# Patient Record
Sex: Male | Born: 1960 | Race: White | Hispanic: No | Marital: Married | Smoking: Never smoker
Health system: Southern US, Community
[De-identification: ages and names within clinical notes are randomized; demographics above are authoritative.]

## PROBLEM LIST (undated history)

## (undated) DIAGNOSIS — E669 Obesity, unspecified: Secondary | ICD-10-CM

## (undated) DIAGNOSIS — Z860100 Personal history of colon polyps, unspecified: Secondary | ICD-10-CM

## (undated) DIAGNOSIS — Z1389 Encounter for screening for other disorder: Secondary | ICD-10-CM

## (undated) HISTORY — DX: Obesity, unspecified: E66.9

## (undated) HISTORY — DX: Personal history of colonic polyps: Z86.010

## (undated) HISTORY — DX: Encounter for screening for other disorder: Z13.89

## (undated) HISTORY — PX: TONSILLECTOMY: SUR1361

## (undated) HISTORY — DX: Personal history of colon polyps, unspecified: Z86.0100

## (undated) HISTORY — PX: SYNOVECTOMY: SHX133

---

## 1999-03-02 HISTORY — PX: KNEE SURGERY: SHX244

## 2007-12-19 ENCOUNTER — Ambulatory Visit: Payer: Self-pay | Admitting: Family Medicine

## 2007-12-28 ENCOUNTER — Ambulatory Visit: Payer: Self-pay | Admitting: Family Medicine

## 2008-03-11 ENCOUNTER — Ambulatory Visit: Payer: Self-pay | Admitting: Vascular Surgery

## 2008-07-01 HISTORY — PX: SHOULDER SURGERY: SHX246

## 2008-07-01 HISTORY — PX: CYST REMOVAL LEG: SHX6280

## 2009-06-30 ENCOUNTER — Emergency Department: Payer: Self-pay | Admitting: Emergency Medicine

## 2011-01-08 ENCOUNTER — Ambulatory Visit: Payer: Self-pay | Admitting: General Surgery

## 2011-01-08 HISTORY — PX: COLONOSCOPY: SHX174

## 2011-01-08 LAB — HM COLONOSCOPY

## 2011-01-10 LAB — PATHOLOGY REPORT

## 2011-07-02 HISTORY — PX: CHOLECYSTECTOMY: SHX55

## 2012-07-24 LAB — TSH: TSH: 3.09 u[IU]/mL (ref 0.41–5.90)

## 2012-07-24 LAB — HEMOGLOBIN A1C: Hemoglobin A1C: 5.6

## 2012-10-07 LAB — CBC AND DIFFERENTIAL
HCT: 49 % (ref 41–53)
Hemoglobin: 16.9 g/dL (ref 13.5–17.5)
Platelets: 185 10*3/uL (ref 150–399)
WBC: 6.6 10^3/mL

## 2012-10-07 LAB — BASIC METABOLIC PANEL
BUN: 15 mg/dL (ref 4–21)
CREATININE: 1.1 mg/dL (ref 0.6–1.3)
GLUCOSE: 82 mg/dL
POTASSIUM: 4.3 mmol/L (ref 3.4–5.3)
SODIUM: 141 mmol/L (ref 137–147)

## 2012-10-08 ENCOUNTER — Ambulatory Visit: Payer: Self-pay | Admitting: Family Medicine

## 2012-10-12 ENCOUNTER — Other Ambulatory Visit: Payer: Self-pay | Admitting: General Surgery

## 2012-10-12 ENCOUNTER — Encounter: Payer: Self-pay | Admitting: General Surgery

## 2012-10-12 ENCOUNTER — Ambulatory Visit (INDEPENDENT_AMBULATORY_CARE_PROVIDER_SITE_OTHER): Payer: BC Managed Care – PPO | Admitting: General Surgery

## 2012-10-12 VITALS — BP 128/70 | HR 74 | Resp 12 | Ht 71.0 in | Wt 232.0 lb

## 2012-10-12 DIAGNOSIS — K8020 Calculus of gallbladder without cholecystitis without obstruction: Secondary | ICD-10-CM | POA: Insufficient documentation

## 2012-10-12 NOTE — Patient Instructions (Addendum)
Patient to have labs drawn at [facility] lab today (Hepatic function). Laparoscopic Cholecystectomy with Intraoperative Cholangiogram. The procedure, including it's potential risks and complications (including but not limited to infection, bleeding, injury to intra-abdominal organs or bile ducts, bile leak, poor cosmetic result, sepsis and death) were discussed with the patient in detail. Non-operative options, including their inherent risks (acute calculous cholecystitis with possible choledocholithiasis or gallstone pancreatitis, with the risk of ascending cholangitis, sepsis, and death) were discussed as well. The patient expressed and understanding of what we discussed and wishes to proceed with laparoscopic cholecystectomy. The patient further understands that if it is technically not possible, or it is unsafe to proceed laparoscopically, that I will convert to an open cholecystectomy.  Patient's surgery has been scheduled for 10-16-12 at [facility].

## 2012-10-12 NOTE — Progress Notes (Signed)
Patient ID: Albert Carpenter, male   DOB: 1961/03/15, 52 y.o.   MRN: [id number]  Chief Complaint  Patient presents with  . Abdominal Pain    HPI Albert Carpenter is a 52 y.o. male here today to discuss recurrent episodes of biliary colic. The patient reports his first episode occurred in 2012. He had 2 severe episodes that year with minimal symptoms until the last 6 months. In the last 4-8 weeks he has had weekly episodes of epigastric pain that usually lasts less than an hour. Most of these episodes have been triggered by dietary intake.    The patient's last episode of pain occurred about one week ago. He was seen in his primary care office and liver function studies at that time showed a significant abnormality prompting abdominal ultrasound.  HPI  History reviewed. No pertinent past medical history.  Past Surgical History  Procedure Laterality Date  . Knee surgery Bilateral 2000's  . Shoulder surgery Right 2010  . Cyst removal leg Left 2010  . Tonsillectomy  kid  . Colonoscopy  2011    No family history on file.  Social History History  Substance Use Topics  . Smoking status: Not on file  . Smokeless tobacco: Never Used  . Alcohol Use: Yes    Allergies  Allergen Reactions  . Bee Venom Swelling    Current Outpatient Prescriptions  Medication Sig Dispense Refill  . Multiple Vitamin (MULTI VITAMIN MENS PO) Take by mouth.       No current facility-administered medications for this visit.    Review of Systems Review of Systems  Constitutional: Negative.   Respiratory: Negative.   Cardiovascular: Negative.   Gastrointestinal: Positive for nausea and vomiting (last week). Negative for diarrhea, constipation, blood in stool, abdominal distention, anal bleeding and rectal pain.    Blood pressure 128/70, pulse 74, resp. rate 12, height 5\' 11"  (1.803 m), weight 232 lb (105.235 kg).  Physical Exam Physical Exam  Constitutional: He appears well-developed and  well-nourished.  Neck: Normal range of motion. Neck supple.  Cardiovascular: Normal rate, regular rhythm and normal heart sounds.   Pulmonary/Chest: Effort normal and breath sounds normal.  Abdominal: Soft. Bowel sounds are normal. There is tenderness in the epigastric area. Hernia: small umbilical hernia.   There is no peritoneal irritation, guarding or referred pain. No evidence of hepatomegaly.   Data Reviewed Abdominal ultrasound dated 10/08/2012 suggested fatty infiltration of the liver. Normal portal venous flow. Gallstones present. Negative sonographic Murphy sign. Gallbladder wall thickness 2.5 mm. Common bile duct 4.6 mm. No hydronephrosis. Borderline splenomegaly.  Laboratory studies dated 10/07/2012 showed a normal CBC and differential. Creatinine 1.1. Normal electrolytes. Minimal elevation of total bilirubin 1.5, alkaline phosphatase 195, SGOT at 313 and modest marked elevation of the SGPT of 1110. H. pylori testing was normal.    Assessment    Biliary colic. Possible passed common bile duct stone, less likely pancreatitis.    Plan    Repeat liver function studies have been obtained today as well as a serum lipase. If the LFTs returning to normal this would speak towards biliary colic as a source for his recurrent episodes of pain. He has no risk factors for hepatitis.  Considering the increasing frequency of episodes over the last 2 months early rather than late elective cholecystectomy has been recommended.       Earline Mayotte 10/12/2012, 7:27 PM

## 2012-10-13 ENCOUNTER — Ambulatory Visit: Payer: Self-pay | Admitting: General Surgery

## 2012-10-13 LAB — HEPATIC FUNCTION PANEL
ALT: 310 IU/L — ABNORMAL HIGH (ref 0–44)
Albumin: 4.4 g/dL (ref 3.5–5.5)
Bilirubin, Direct: 0.28 mg/dL (ref 0.00–0.40)
Total Bilirubin: 0.6 mg/dL (ref 0.0–1.2)
Total Protein: 6.3 g/dL (ref 6.0–8.5)

## 2012-10-14 LAB — SPECIMEN STATUS REPORT

## 2012-10-16 ENCOUNTER — Ambulatory Visit: Payer: Self-pay | Admitting: General Surgery

## 2012-10-16 DIAGNOSIS — K812 Acute cholecystitis with chronic cholecystitis: Secondary | ICD-10-CM

## 2012-10-17 ENCOUNTER — Telehealth: Payer: Self-pay | Admitting: General Surgery

## 2012-10-17 NOTE — Telephone Encounter (Signed)
The patient underwent a laparoscopic cholecystectomy and cholangiogram just day. He was contacted by phone today to check on his progress. He is sore but obtaining good relief with the use of a heating pad. Restrictions after repair of the umbilical hernia and extraction of the multiple gallstones in the umbilical site was discussed. He will followup next week with the nurse for postoperative wound check and in position exam after his return from travel in about 2 weeks.

## 2012-10-19 ENCOUNTER — Encounter: Payer: Self-pay | Admitting: *Deleted

## 2012-10-19 ENCOUNTER — Encounter: Payer: Self-pay | Admitting: General Surgery

## 2012-10-20 ENCOUNTER — Encounter: Payer: Self-pay | Admitting: General Surgery

## 2012-10-21 ENCOUNTER — Encounter: Payer: Self-pay | Admitting: General Surgery

## 2012-10-21 ENCOUNTER — Ambulatory Visit (INDEPENDENT_AMBULATORY_CARE_PROVIDER_SITE_OTHER): Payer: BC Managed Care – PPO | Admitting: General Surgery

## 2012-10-21 VITALS — BP 122/82 | HR 62 | Resp 14 | Ht 73.0 in | Wt 228.0 lb

## 2012-10-21 DIAGNOSIS — K8020 Calculus of gallbladder without cholecystitis without obstruction: Secondary | ICD-10-CM

## 2012-10-21 NOTE — Progress Notes (Signed)
Patient ID: Albert Carpenter, male   DOB: May 09, 1961, 52 y.o.   MRN: [id number]  Chief Complaint  Patient presents with  . Routine Post Op    7-10 day gallbladder post op    HPI Albert Carpenter is a 52 y.o. male here for post op gallbladder surgery. The procedure was performed on 10/16/12. The patient states some abdominal soreness near incision sites. He states his bowels have slowed down since surgery. He denies any use of pain medications after surgery.  HPI  History reviewed. No pertinent past medical history.  Past Surgical History  Procedure Laterality Date  . Knee surgery Bilateral 2000's  . Shoulder surgery Right 2010  . Cyst removal leg Left 2010  . Tonsillectomy  kid  . Colonoscopy  2011  . Cholecystectomy  2014    History reviewed. No pertinent family history.  Social History History  Substance Use Topics  . Smoking status: Never Smoker   . Smokeless tobacco: Never Used  . Alcohol Use: Yes    Allergies  Allergen Reactions  . Bee Venom Swelling    Current Outpatient Prescriptions  Medication Sig Dispense Refill  . Multiple Vitamin (MULTI VITAMIN MENS PO) Take by mouth.       No current facility-administered medications for this visit.    Review of Systems Review of Systems  Constitutional: Negative.   Respiratory: Negative.   Cardiovascular: Negative.   Gastrointestinal: Negative.     Blood pressure 122/82, pulse 62, resp. rate 14, height 6\' 1"  (1.854 m), weight 228 lb (103.42 kg).  Physical Exam Physical Exam  Constitutional: He appears well-developed and well-nourished.  Neck: Normal range of motion. Neck supple.  Cardiovascular: Normal rate, regular rhythm and normal heart sounds.   Pulmonary/Chest: Effort normal and breath sounds normal.  Abdominal:  Excision site healing well    Data Reviewed Pathology showed chronic cholecystitis and cholelithiasis.  Assessment    Doing well status post cholecystectomy.     Plan    Care was  strenuous lifting for the next 2 weeks was advised as the umbilical incision was expanded (and subsequently close) to extract the thickened gallbladder and multiple stones.        Earline Mayotte 10/21/2012, 10:10 PM

## 2012-10-21 NOTE — Patient Instructions (Signed)
Patient may resume activities as tolerated, taking care to use proper technique when lifting (demonstrated) 

## 2012-10-22 ENCOUNTER — Encounter: Payer: Self-pay | Admitting: General Surgery

## 2012-10-23 ENCOUNTER — Telehealth: Payer: Self-pay | Admitting: General Surgery

## 2012-10-23 NOTE — Telephone Encounter (Signed)
The patient had had his annual laboratory studies obtained 10/20/2012 and forwarded to the office. His liver function studies show continued improvement from that noted on April 15 and prior laboratory studies the week before that. They did check GGT, which has not been checked for. This is elevated at 380. The serum transaminases are now markedly improved from his early April labs and his bilirubin and alkaline phosphatase are normal.  No additional followup as needed from my point of view.  CC: Sharen Hint, FNP

## 2013-01-14 ENCOUNTER — Encounter: Payer: Self-pay | Admitting: *Deleted

## 2013-08-02 ENCOUNTER — Ambulatory Visit: Payer: Self-pay | Admitting: Family Medicine

## 2014-10-21 NOTE — Op Note (Signed)
PATIENT NAME:  Albert Carpenter, Albert Carpenter MR#:  [id number] DATE OF BIRTH:  08/12/1960  DATE OF PROCEDURE:  10/16/2012  PREOPERATIVE DIAGNOSIS:  Chronic cholecystitis and cholelithiasis.   POSTOPERATIVE DIAGNOSIS:  Chronic cholecystitis and cholelithiasis.   OPERATIVE PROCEDURE:  Laparoscopic cholecystectomy with intraoperative cholangiograms.   SURGEON:  Robert Bellow, MD  ANESTHESIA:  General endotracheal under Dr. Marcello Moores.   ESTIMATED BLOOD LOSS:  Less than 5 mL.   CLINICAL NOTE:  This 54 year old male who has had a one-year history of intermittent abdominal pain. This has become more frequent in the last six months. Last week he was noted to have elevated liver function studies. These have returned to near-normal value. Ultrasound showed evidence of cholelithiasis. He was admitted for elective cholecystectomy.   OPERATIVE NOTE:  With the patient under adequate general endotracheal anesthesia, the abdomen was prepped with ChloraPrep and draped. The patient was placed in the reverse Trendelenburg position. Palpation of the umbilicus showed a small fascial defect. A transumbilical incision was made and carried down through the skin. The Veress needle was passed through the small fascial defect without incident. After assuring intra-abdominal location with the hanging drop test, the abdomen was insufflated with CO2 at 10 mmHg pressure. A 10-mm step port was expanded and inspection showed no evidence of injury from initial port placement. The patient was placed in a reverse Trendelenburg position and rolled to the left. An 11 mm Xcel port was placed in the epigastrium and two 5-mm step ports placed laterally under direct vision. The gallbladder was noted to be markedly thickened. Significant chronic inflammation. The omentum was adherent to the undersurface of the gallbladder, and this was taken down with cautery dissection. The gallbladder was placed on cephalad traction, and the neck of the gallbladder  dissected free. Attempts to complete a cholangiogram making use of a Kumar catheter were unsuccessful. The cystic duct was cleared and divided and a 3 to 4 mm stone extracted from the distal cystic duct. This then resulted in clear bile passage. A Reddick catheter was placed and 20 mL of 1/2 strength Conray 60 was used for cholangiograms. Initially there filling of a long cystic duct and the common bile duct with free flow into the duodenum. With further pressure, reflux into the common hepatic duct was identified, and normal right and left hepatic ducts were seen. No evidence of retained stones. The cystic duct and cystic artery branches were doubly clipped and divided. The gallbladder was then removed from the liver bed making use of hook cautery dissection. There was a small rent in the gallbladder and for that reason was placed in an EndoCatch bag. The gallbladder wall was significantly thickened. The incision at the umbilicus was expanded to allow extraction of the gallbladder wall and multiple stones. The fascial defect was closed with 0 Vicryl figure-of-eight sutures x 2. After re-establishing pneumoperitoneum, inspection from the epigastric site showed no evidence of injury from initial port placement or repair of the fascial defect. The right upper quadrant was irrigated. A small area of bleeding from the edge of the liver was treated with cautery and then with a small piece of Surgicel. This showed good hemostasis. The right upper quadrant was irrigated with lactated Ringer's solution. The previously placed clips were felt to be appropriate, and the abdomen desufflated and ports removed under direct vision. The fascial sutures were tied, and the skin incisions were closed with 4-0 Vicryl subcuticular sutures. Benzoin, Steri-Strips, Telfa, and Tegaderm dressing was then applied.   The  patient tolerated the procedure well and was taken to the recovery room in stable condition.    ____________________________ Robert Bellow, MD jwb:jm D: 10/16/2012 15:11:03 ET T: 10/17/2012 10:45:07 ET JOB#: [id number]  cc: Robert Bellow, MD, <Dictator> Meredith Leeds, NP Ariatna Jester Amedeo Kinsman MD ELECTRONICALLY SIGNED 10/17/2012 12:34

## 2015-02-13 ENCOUNTER — Telehealth: Payer: Self-pay | Admitting: Emergency Medicine

## 2015-02-13 NOTE — Telephone Encounter (Signed)
I do not have any openings today. Can he see Tawanna Sat?. I might be able to see him later in the week.

## 2015-02-13 NOTE — Telephone Encounter (Signed)
This pt is a Dr. Venia Minks pt. Along with his wife. Terri took the phone call and asked me to send a message to you (so I did not talk to the pt). Pt son would like to be a new pt here. Being that Dr. Venia Minks is not here and pt needs a PCP as soon as possible. Pt is 54 years old, he has some addiction  issues he has finally agreed to see someone about this. (I think it is a situation where he agreed to get help so they are trying to get it for him as fast as they can, before he changes it mind)  He would also needs a CPE at some point. Would you be willing to see this pt for Dr. Venia Minks?

## 2015-02-14 NOTE — Telephone Encounter (Signed)
Patient is coming in Thursday 18th at 2:00.  Albert Carpenter

## 2015-03-13 ENCOUNTER — Telehealth: Payer: Self-pay | Admitting: Family Medicine

## 2015-06-16 ENCOUNTER — Encounter: Payer: Self-pay | Admitting: Family Medicine

## 2015-06-29 NOTE — Telephone Encounter (Signed)
done

## 2015-07-06 ENCOUNTER — Telehealth: Payer: Self-pay | Admitting: Family Medicine

## 2015-07-27 DIAGNOSIS — R748 Abnormal levels of other serum enzymes: Secondary | ICD-10-CM | POA: Insufficient documentation

## 2015-07-27 DIAGNOSIS — Z91030 Bee allergy status: Secondary | ICD-10-CM | POA: Insufficient documentation

## 2015-07-27 DIAGNOSIS — E669 Obesity, unspecified: Secondary | ICD-10-CM | POA: Insufficient documentation

## 2015-07-27 DIAGNOSIS — M546 Pain in thoracic spine: Secondary | ICD-10-CM | POA: Insufficient documentation

## 2015-07-27 DIAGNOSIS — K5730 Diverticulosis of large intestine without perforation or abscess without bleeding: Secondary | ICD-10-CM | POA: Insufficient documentation

## 2015-07-27 DIAGNOSIS — D126 Benign neoplasm of colon, unspecified: Secondary | ICD-10-CM | POA: Insufficient documentation

## 2015-07-27 DIAGNOSIS — Z6831 Body mass index (BMI) 31.0-31.9, adult: Secondary | ICD-10-CM | POA: Insufficient documentation

## 2015-07-27 DIAGNOSIS — G2581 Restless legs syndrome: Secondary | ICD-10-CM | POA: Insufficient documentation

## 2015-07-27 DIAGNOSIS — E6609 Other obesity due to excess calories: Secondary | ICD-10-CM | POA: Insufficient documentation

## 2015-07-31 ENCOUNTER — Telehealth: Payer: Self-pay | Admitting: Family Medicine

## 2015-07-31 ENCOUNTER — Encounter: Payer: Self-pay | Admitting: Family Medicine

## 2015-07-31 ENCOUNTER — Ambulatory Visit (INDEPENDENT_AMBULATORY_CARE_PROVIDER_SITE_OTHER): Payer: BLUE CROSS/BLUE SHIELD | Admitting: Family Medicine

## 2015-07-31 VITALS — BP 112/80 | HR 99 | Temp 97.8°F | Resp 16 | Ht 71.0 in | Wt 248.0 lb

## 2015-07-31 DIAGNOSIS — K635 Polyp of colon: Secondary | ICD-10-CM | POA: Diagnosis not present

## 2015-07-31 DIAGNOSIS — Z0000 Encounter for general adult medical examination without abnormal findings: Secondary | ICD-10-CM

## 2015-07-31 NOTE — Telephone Encounter (Signed)
FYI--Pt wanted to let you know that he will be getting lab work done through [facility] 08/08/15 and will make sure that you get the results

## 2015-07-31 NOTE — Progress Notes (Signed)
Patient ID: ZAHKI CYPERT, male   DOB: 05-14-1961, 55 y.o.   MRN: [id number]       Patient: Albert Carpenter, Male    DOB: 1961/04/07, 55 y.o.   MRN: [id number] Visit Date: 07/31/2015  Today's Provider: Margarita Rana, MD   Chief Complaint  Patient presents with  . Annual Exam   Subjective:    Annual physical exam Albert Carpenter is a 55 y.o. male who presents today for health maintenance and complete physical. He feels fairly well. He reports exercising none. He reports he is sleeping well. 07/24/12 CPE 09/2014 Patient had labs done at [facility]  -----------------------------------------------------------------  Review of Systems  Constitutional: Negative.   HENT: Positive for congestion, postnasal drip and sore throat.   Eyes: Negative.   Respiratory: Positive for cough.   Cardiovascular: Negative.   Gastrointestinal: Positive for abdominal pain.  Endocrine: Negative.   Genitourinary: Negative.   Musculoskeletal: Negative.   Skin: Negative.   Allergic/Immunologic: Negative.   Neurological: Negative.   Hematological: Negative.   Psychiatric/Behavioral: Negative.     Social History      He  reports that he has never smoked. He has never used smokeless tobacco. He reports that he drinks alcohol. He reports that he does not use illicit drugs.       Social History   Social History  . Marital Status: Married    Spouse Name: N/A  . Number of Children: N/A  . Years of Education: N/A   Social History Main Topics  . Smoking status: Never Smoker   . Smokeless tobacco: Never Used  . Alcohol Use: Yes     Comment: occasional  . Drug Use: No  . Sexual Activity: Not Asked   Other Topics Concern  . None   Social History Narrative    Past Medical History  Diagnosis Date  . Obesity, unspecified   . Screening for obesity      Patient Active Problem List   Diagnosis Date Noted  . 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 Diverticulosis of colon 07/27/2015  .  Abnormal liver enzymes 07/27/2015  . Back pain, thoracic 07/27/2015  . Adiposity 07/27/2015  . 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 Restless leg 07/27/2015  . Gallstone 10/12/2012    Past Surgical History  Procedure Laterality Date  . Knee surgery Bilateral 2000's  . Shoulder surgery Right 2010  . Cyst removal leg Left 2010  . Tonsillectomy  kid  . Colonoscopy  2011  . Cholecystectomy  2014  . Synovectomy      due to complex medical bilateral meniscus tear    Family History        Family Status  Relation Status Death Age  . Mother Alive   . Father Deceased 44    MI  . Sister Deceased 31    hepatitis  . Son Alive   . Son Alive   . Daughter Alive         His family history includes Alzheimer's disease in his mother; Healthy in his daughter, son, and son; Heart attack in his father; Hepatitis in his sister; Obesity in his father.    Allergies  Allergen Reactions  . Bee Venom Swelling    Previous Medications   CHOLECALCIFEROL (VITAMIN D) 1000 UNITS TABLET    Take 1,000 Units by mouth daily.   EPINEPHRINE (EPIPEN 2-PAK) 0.3 MG/0.3 ML IJ SOAJ INJECTION    EPIPEN 2-PAK, 0.3MG /0.3ML (Injection Solution Auto-injector)  1 (one) Soln Auto-inj Soln  Auto-inj injected into outer thigh as needed for 0 days  Quantity: 1;  Refills: 12   Ordered :08-Feb-2013  Gerald Leitz ;  Started 08-Feb-2013 Active Comments: Medication taken as needed.    MULTIPLE VITAMIN (MULTI VITAMIN MENS PO)    Take by mouth.    Patient Care Team: Margarita Rana, MD as PCP - General (Family Medicine) Christene Lye, MD (General Surgery)     Objective:   Vitals: BP 112/80 mmHg  Pulse 99  Temp(Src) 97.8 F (36.6 C) (Oral)  Resp 16  Ht 5\' 11"  (1.803 m)  Wt 248 lb (112.492 kg)  BMI 34.60 kg/m2  SpO2 96%   Physical Exam  Constitutional: He is oriented to person, place, and time. He appears well-developed and well-nourished.  HENT:  Head: Normocephalic and atraumatic.  Right Ear:  External ear normal.  Left Ear: External ear normal.  Nose: Nose normal.  Mouth/Throat: Oropharynx is clear and moist.  Eyes: Conjunctivae and EOM are normal. Pupils are equal, round, and reactive to light.  Neck: Normal range of motion. Neck supple.  Cardiovascular: Normal rate, regular rhythm and normal heart sounds.   Pulmonary/Chest: Effort normal and breath sounds normal.  Abdominal: Soft. Bowel sounds are normal. There is tenderness.  Musculoskeletal: Normal range of motion.  Neurological: He is alert and oriented to person, place, and time.  Skin: Skin is warm and dry.  Psychiatric: He has a normal mood and affect. His behavior is normal. Judgment and thought content normal.     Depression Screen PHQ 2/9 Scores 07/31/2015  PHQ - 2 Score 0      Assessment & Plan:     Routine Health Maintenance and Physical Exam  Exercise Activities and Dietary recommendations Goals    None      Immunization History  Administered Date(s) Administered  . Tdap 05/20/2011     1. Annual physical exam Stable. HgbA1c was 5.7. Eat healthy and exercise and recheck labs next month. Other labs were normal.   Will look into shingles vaccine.     2. Colon polyps Needs repeat.   - Ambulatory referral to Gastroenterology   Patient seen and examined by Dr. Jerrell Belfast, and note scribed by Philbert Riser. Dimas, CMA.  I have reviewed the document for accuracy and completeness and I agree with above. Jerrell Belfast, MD   Margarita Rana, MD   --------------------------------------------------------------------

## 2015-07-31 NOTE — Telephone Encounter (Signed)
Verbally relayed information to Dr. Venia Minks. Renaldo Fiddler, CMA

## 2015-08-02 ENCOUNTER — Encounter: Payer: Self-pay | Admitting: Family Medicine

## 2015-08-07 ENCOUNTER — Ambulatory Visit: Payer: Self-pay | Admitting: General Surgery

## 2015-08-10 ENCOUNTER — Ambulatory Visit: Payer: Self-pay | Admitting: General Surgery

## 2015-08-16 ENCOUNTER — Encounter: Payer: Self-pay | Admitting: Family Medicine

## 2015-10-10 ENCOUNTER — Encounter: Payer: Self-pay | Admitting: *Deleted

## 2015-11-16 ENCOUNTER — Encounter: Payer: Self-pay | Admitting: General Surgery

## 2015-11-20 ENCOUNTER — Encounter: Payer: Self-pay | Admitting: General Surgery

## 2015-11-20 ENCOUNTER — Ambulatory Visit (INDEPENDENT_AMBULATORY_CARE_PROVIDER_SITE_OTHER): Payer: BLUE CROSS/BLUE SHIELD | Admitting: General Surgery

## 2015-11-20 VITALS — BP 120/70 | HR 72 | Resp 13 | Ht 71.0 in | Wt 243.0 lb

## 2015-11-20 DIAGNOSIS — K635 Polyp of colon: Secondary | ICD-10-CM

## 2015-11-20 DIAGNOSIS — Z86010 Personal history of colonic polyps: Secondary | ICD-10-CM | POA: Insufficient documentation

## 2015-11-20 DIAGNOSIS — Z1211 Encounter for screening for malignant neoplasm of colon: Secondary | ICD-10-CM

## 2015-11-20 MED ORDER — POLYETHYLENE GLYCOL 3350 17 GM/SCOOP PO POWD: ORAL | Status: DC

## 2015-11-20 NOTE — H&P (Signed)
HPI Albert Carpenter is a 55 y.o. male here for a colonoscopy discussion. His last colonoscopy was 01/08/11. No GI problems at this time. Moves his bowels daily. The patient recalls that I told him his prep was less than ideal time of his last colonoscopy, although review of the procedure note showed this not to be an issue.  I personally reviewed the patient's history. HPI  Past Medical History  Diagnosis Date  . Obesity, unspecified   . Screening for obesity   . History of colon polyps     Past Surgical History  Procedure Laterality Date  . Knee surgery Bilateral 2000's  . Shoulder surgery Right 2010  . Cyst removal leg Left 2010  . Tonsillectomy  kid  . Colonoscopy  01/08/11  . Synovectomy      due to complex medical bilateral meniscus tear  . Cholecystectomy  2013    Family History  Problem Relation Age of Onset  . Alzheimer's disease Mother   . Heart attack Father   . Obesity Father   . Hepatitis Sister   . Healthy Son   . Healthy Son   . Healthy Daughter     Social History Social History  Substance Use Topics  . Smoking status: Never Smoker   . Smokeless tobacco: Never Used  . Alcohol Use: Yes     Comment: occasional    Allergies  Allergen Reactions  . Bee Venom Swelling    Current Outpatient Prescriptions  Medication Sig Dispense Refill  . polyethylene glycol powder (GLYCOLAX/MIRALAX) powder 255 grams one bottle for colonoscopy prep 255 g 0   No current facility-administered medications for this visit.    Review of Systems Review of Systems  Constitutional: Negative.   Respiratory: Negative.   Cardiovascular: Negative.   Gastrointestinal: Negative.     Blood pressure 120/70, pulse 72, resp. rate 13, height 5\' 11"  (1.803 m), weight 243 lb (110.224 kg).  Physical Exam Physical Exam  Constitutional: He is oriented to person, place, and time. He appears well-developed.  Eyes: Conjunctivae are normal. No scleral icterus.  Neck: Neck supple.   Cardiovascular: Normal rate, regular rhythm and normal heart sounds.   Pulmonary/Chest: Effort normal and breath sounds normal.  Lymphadenopathy:    He has no cervical adenopathy.  Neurological: He is alert and oriented to person, place, and time.  Skin: Skin is warm and dry.    Data Reviewed Tubular adenoma of the sigmoid colon.  Assessment    Candidate for follow-up colonoscopy.    Plan     Colonoscopy with possible biopsy/polypectomy prn: Information regarding the procedure, including its potential risks and complications (including but not limited to perforation of the bowel, which may require emergency surgery to repair, and bleeding) was verbally given to the patient. Educational information regarding lower intestinal endoscopy was given to the patient. Written instructions for how to complete the bowel prep using Miralax were provided. The importance of drinking ample fluids to avoid dehydration as a result of the prep emphasized.  Patient has been scheduled for a colonoscopy on 12-12-15 at [facility]. This patient has also been asked to use Dulcolax 5 mg tablets in addition to Miralax. He will take 2 tablets the morning and afternoon of prep.     PCP: Edison Nasuti This has been scribed by Lesly Rubenstein LPN    Robert Bellow 11/20/2015, 9:04 PM

## 2015-11-20 NOTE — Patient Instructions (Addendum)
Colonoscopy A colonoscopy is an exam to look at the entire large intestine (colon). This exam can help find problems such as tumors, polyps, inflammation, and areas of bleeding. The exam takes about 1 hour.  LET [facility] CARE PROVIDER KNOW ABOUT:   Any allergies you have.  All medicines you are taking, including vitamins, herbs, eye drops, creams, and over-the-counter medicines.  Previous problems you or members of your family have had with the use of anesthetics.  Any blood disorders you have.  Previous surgeries you have had.  Medical conditions you have. RISKS AND COMPLICATIONS  Generally, this is a safe procedure. However, as with any procedure, complications can occur. Possible complications include:  Bleeding.  Tearing or rupture of the colon wall.  Reaction to medicines given during the exam.  Infection (rare). BEFORE THE PROCEDURE   Ask your health care provider about changing or stopping your regular medicines.  You may be prescribed an oral bowel prep. This involves drinking a large amount of medicated liquid, starting the day before your procedure. The liquid will cause you to have multiple loose stools until your stool is almost clear or light green. This cleans out your colon in preparation for the procedure.  Do not eat or drink anything else once you have started the bowel prep, unless your health care provider tells you it is safe to do so.  Arrange for someone to drive you home after the procedure. PROCEDURE   You will be given medicine to help you relax (sedative).  You will lie on your side with your knees bent.  A long, flexible tube with a light and camera on the end (colonoscope) will be inserted through the rectum and into the colon. The camera sends video back to a computer screen as it moves through the colon. The colonoscope also releases carbon dioxide gas to inflate the colon. This helps your health care provider see the area better.  During  the exam, your health care provider may take a small tissue sample (biopsy) to be examined under a microscope if any abnormalities are found.  The exam is finished when the entire colon has been viewed. AFTER THE PROCEDURE   Do not drive for 24 hours after the exam.  You may have a small amount of blood in your stool.  You may pass moderate amounts of gas and have mild abdominal cramping or bloating. This is caused by the gas used to inflate your colon during the exam.  Ask when your test results will be ready and how you will get your results. Make sure you get your test results.   This information is not intended to replace advice given to you by your health care provider. Make sure you discuss any questions you have with your health care provider.   Document Released: 06/14/2000 Document Revised: 04/07/2013 Document Reviewed: 02/22/2013 Elsevier Interactive Patient Education [employer].   Patient has been scheduled for a colonoscopy on 12-12-15 at [facility]. This patient has also been asked to use Dulcolax 5 mg tablets in addition to Miralax. He will take 2 tablets the morning and afternoon of prep.

## 2015-11-20 NOTE — Progress Notes (Signed)
Patient ID: Albert Carpenter, male   DOB: 03-29-1961, 55 y.o.   MRN: [id number]  Chief Complaint  Patient presents with  . Colonoscopy    HPI Albert Carpenter is a 55 y.o. male here for a colonoscopy discussion. His last colonoscopy was 01/08/11. No GI problems at this time. Moves his bowels daily. The patient recalls that I told him his prep was less than ideal time of his last colonoscopy, although review of the procedure note showed this not to be an issue.  I personally reviewed the patient's history. HPI  Past Medical History  Diagnosis Date  . Obesity, unspecified   . Screening for obesity   . History of colon polyps     Past Surgical History  Procedure Laterality Date  . Knee surgery Bilateral 2000's  . Shoulder surgery Right 2010  . Cyst removal leg Left 2010  . Tonsillectomy  kid  . Colonoscopy  01/08/11  . Synovectomy      due to complex medical bilateral meniscus tear  . Cholecystectomy  2013    Family History  Problem Relation Age of Onset  . Alzheimer's disease Mother   . Heart attack Father   . Obesity Father   . Hepatitis Sister   . Healthy Son   . Healthy Son   . Healthy Daughter     Social History Social History  Substance Use Topics  . Smoking status: Never Smoker   . Smokeless tobacco: Never Used  . Alcohol Use: Yes     Comment: occasional    Allergies  Allergen Reactions  . Bee Venom Swelling    Current Outpatient Prescriptions  Medication Sig Dispense Refill  . polyethylene glycol powder (GLYCOLAX/MIRALAX) powder 255 grams one bottle for colonoscopy prep 255 g 0   No current facility-administered medications for this visit.    Review of Systems Review of Systems  Constitutional: Negative.   Respiratory: Negative.   Cardiovascular: Negative.   Gastrointestinal: Negative.     Blood pressure 120/70, pulse 72, resp. rate 13, height 5\' 11"  (1.803 m), weight 243 lb (110.224 kg).  Physical Exam Physical Exam  Constitutional: He  is oriented to person, place, and time. He appears well-developed.  Eyes: Conjunctivae are normal. No scleral icterus.  Neck: Neck supple.  Cardiovascular: Normal rate, regular rhythm and normal heart sounds.   Pulmonary/Chest: Effort normal and breath sounds normal.  Lymphadenopathy:    He has no cervical adenopathy.  Neurological: He is alert and oriented to person, place, and time.  Skin: Skin is warm and dry.    Data Reviewed Tubular adenoma of the sigmoid colon.  Assessment    Candidate for follow-up colonoscopy.    Plan     Colonoscopy with possible biopsy/polypectomy prn: Information regarding the procedure, including its potential risks and complications (including but not limited to perforation of the bowel, which may require emergency surgery to repair, and bleeding) was verbally given to the patient. Educational information regarding lower intestinal endoscopy was given to the patient. Written instructions for how to complete the bowel prep using Miralax were provided. The importance of drinking ample fluids to avoid dehydration as a result of the prep emphasized.  Patient has been scheduled for a colonoscopy on 12-12-15 at [facility]. This patient has also been asked to use Dulcolax 5 mg tablets in addition to Miralax. He will take 2 tablets the morning and afternoon of prep.     PCP: Edison Nasuti This has been scribed by Lesly Rubenstein LPN  Robert Bellow 11/20/2015, 9:04 PM

## 2015-12-12 ENCOUNTER — Ambulatory Visit: Payer: BLUE CROSS/BLUE SHIELD | Admitting: Certified Registered"

## 2015-12-12 ENCOUNTER — Ambulatory Visit
Admission: RE | Admit: 2015-12-12 | Discharge: 2015-12-12 | Disposition: A | Discharge status: Home / Self Care | Payer: BLUE CROSS/BLUE SHIELD | Source: Ambulatory Visit | Attending: General Surgery | Admitting: General Surgery

## 2015-12-12 ENCOUNTER — Encounter
Admission: RE | Disposition: A | Discharge status: Home / Self Care | Payer: Self-pay | Source: Ambulatory Visit | Attending: General Surgery

## 2015-12-12 ENCOUNTER — Encounter: Payer: Self-pay | Admitting: Anesthesiology

## 2015-12-12 DIAGNOSIS — K5730 Diverticulosis of large intestine without perforation or abscess without bleeding: Secondary | ICD-10-CM | POA: Diagnosis not present

## 2015-12-12 DIAGNOSIS — D124 Benign neoplasm of descending colon: Secondary | ICD-10-CM | POA: Insufficient documentation

## 2015-12-12 DIAGNOSIS — K5130 Ulcerative (chronic) rectosigmoiditis without complications: Secondary | ICD-10-CM | POA: Diagnosis not present

## 2015-12-12 DIAGNOSIS — Z86010 Personal history of colonic polyps: Secondary | ICD-10-CM | POA: Diagnosis not present

## 2015-12-12 DIAGNOSIS — Z1211 Encounter for screening for malignant neoplasm of colon: Secondary | ICD-10-CM

## 2015-12-12 HISTORY — PX: COLONOSCOPY WITH PROPOFOL: SHX5780

## 2015-12-12 SURGERY — COLONOSCOPY WITH PROPOFOL
Anesthesia: General

## 2015-12-12 MED ORDER — PROPOFOL 500 MG/50ML IV EMUL
INTRAVENOUS | Status: DC | PRN
  Administered 2015-12-12: 120 ug/kg/min via INTRAVENOUS

## 2015-12-12 MED ORDER — LIDOCAINE 2% (20 MG/ML) 5 ML SYRINGE
INTRAMUSCULAR | Status: DC | PRN
  Administered 2015-12-12: 25 mg via INTRAVENOUS

## 2015-12-12 MED ORDER — SODIUM CHLORIDE 0.9 % IV SOLN
INTRAVENOUS | Status: DC
  Administered 2015-12-12: 13:00:00 via INTRAVENOUS

## 2015-12-12 MED ORDER — PROPOFOL 10 MG/ML IV BOLUS
INTRAVENOUS | Status: DC | PRN
  Administered 2015-12-12: 100 mg via INTRAVENOUS

## 2015-12-12 NOTE — Anesthesia Postprocedure Evaluation (Signed)
Anesthesia Post Note  Patient: RACE HIDER  Procedure(s) Performed: Procedure(s) (LRB): COLONOSCOPY WITH PROPOFOL (N/A)  Patient location during evaluation: Endoscopy Anesthesia Type: General Level of consciousness: awake and alert Pain management: pain level controlled Vital Signs Assessment: post-procedure vital signs reviewed and stable Respiratory status: spontaneous breathing, nonlabored ventilation, respiratory function stable and patient connected to nasal cannula oxygen Cardiovascular status: blood pressure returned to baseline and stable Postop Assessment: no signs of nausea or vomiting Anesthetic complications: no    Last Vitals:  Filed Vitals:   12/12/15 1402 12/12/15 1412  BP: 103/78 102/77  Pulse: 63 63  Temp:    Resp: 14 22    Last Pain: There were no vitals filed for this visit.               Chaka Boyson S

## 2015-12-12 NOTE — Transfer of Care (Signed)
Immediate Anesthesia Transfer of Care Note  Patient: Albert Carpenter  Procedure(s) Performed: Procedure(s): COLONOSCOPY WITH PROPOFOL (N/A)  Patient Location: PACU  Anesthesia Type:General  Level of Consciousness: awake and alert   Airway & Oxygen Therapy: Patient Spontanous Breathing and Patient connected to nasal cannula oxygen  Post-op Assessment: Report given to RN and Post -op Vital signs reviewed and stable  Post vital signs: Reviewed  Last Vitals:  Filed Vitals:   12/12/15 1243 12/12/15 1341  BP: 125/89 97/61  Pulse: 68 76  Temp: 36.7 C 36.2 C  Resp: 18 14    Last Pain: There were no vitals filed for this visit.       Complications: No apparent anesthesia complications

## 2015-12-12 NOTE — Anesthesia Preprocedure Evaluation (Addendum)
Anesthesia Evaluation  Patient identified by MRN, date of birth, ID band Patient awake    Reviewed: Allergy & Precautions, NPO status , Patient's Chart, lab work & pertinent test results, reviewed documented beta blocker date and time   Airway Mallampati: II  TM Distance: >3 FB     Dental  (+) Chipped   Pulmonary           Cardiovascular      Neuro/Psych    GI/Hepatic   Endo/Other    Renal/GU      Musculoskeletal   Abdominal   Peds  Hematology   Anesthesia Other Findings RLS.  Reproductive/Obstetrics                            Anesthesia Physical Anesthesia Plan  ASA: III  Anesthesia Plan: General   Post-op Pain Management:    Induction: Intravenous  Airway Management Planned: Nasal Cannula  Additional Equipment:   Intra-op Plan:   Post-operative Plan:   Informed Consent: I have reviewed the patients History and Physical, chart, labs and discussed the procedure including the risks, benefits and alternatives for the proposed anesthesia with the patient or authorized representative who has indicated his/her understanding and acceptance.     Plan Discussed with: CRNA  Anesthesia Plan Comments:         Anesthesia Quick Evaluation

## 2015-12-12 NOTE — Op Note (Signed)
[facility] Patient Name: Albert Carpenter Procedure Date: 12/12/2015 1:12 PM MRN: [id number] Account #: [id number] Date of Birth: January 27, 1961 Admit Type: Outpatient Age: 55 Room: [facility] ENDO ROOM 1 Gender: Male Note Status: Finalized Procedure:            Colonoscopy Indications:          High risk colon cancer surveillance: Personal history                        of colonic polyps Providers:            Robert Bellow, MD Referring MD:         Jerrell Belfast, MD (Referring MD) Medicines:            Monitored Anesthesia Care Complications:        No immediate complications. Procedure:            Pre-Anesthesia Assessment:                       - Prior to the procedure, a History and Physical was                        performed, and patient medications, allergies and                        sensitivities were reviewed. The patient's tolerance of                        previous anesthesia was reviewed.                       - The risks and benefits of the procedure and the                        sedation options and risks were discussed with the                        patient. All questions were answered and informed                        consent was obtained.                       After obtaining informed consent, the colonoscope was                        passed under direct vision. Throughout the procedure,                        the patient's blood pressure, pulse, and oxygen                        saturations were monitored continuously. The                        Colonoscope was introduced through the anus and                        advanced to the the terminal ileum. The colonoscopy was  performed without difficulty. The patient tolerated the                        procedure well. The quality of the bowel preparation                        was excellent. Findings:      A few large-mouthed diverticula were found in  the sigmoid colon.      A 5 mm polyp was found in the descending colon. The polyp was sessile.       Biopsies were taken with a cold forceps for histology.      The retroflexed view of the distal rectum and anal verge was normal and       showed no anal or rectal abnormalities. Impression:           - Diverticulosis in the sigmoid colon.                       - One 5 mm polyp in the descending colon. Biopsied.                       - The distal rectum and anal verge are normal on                        retroflexion view. Recommendation:       - Telephone endoscopist for pathology results in 1 week. Procedure Code(s):    --- Professional ---                       [phone], Colonoscopy, flexible; with biopsy, single or                        multiple Diagnosis Code(s):    --- Professional ---                       Z86.010, Personal history of colonic polyps                       D12.4, Benign neoplasm of descending colon                       K57.30, Diverticulosis of large intestine without                        perforation or abscess without bleeding CPT copyright 2016 American Medical Association. All rights reserved. The codes documented in this report are preliminary and upon coder review may  be revised to meet current compliance requirements. Robert Bellow, MD 12/12/2015 1:40:29 PM This report has been signed electronically. Number of Addenda: 0 Note Initiated On: 12/12/2015 1:12 PM Scope Withdrawal Time: 0 hours 8 minutes 47 seconds  Total Procedure Duration: 0 hours 12 minutes 27 seconds       [facility]

## 2015-12-12 NOTE — H&P (Signed)
No change in clinical history or exam.  Small emesis with prep. For colonoscopy.

## 2015-12-13 ENCOUNTER — Encounter: Payer: Self-pay | Admitting: General Surgery

## 2015-12-13 LAB — SURGICAL PATHOLOGY

## 2015-12-14 ENCOUNTER — Telehealth: Payer: Self-pay

## 2015-12-14 NOTE — Telephone Encounter (Signed)
-----   Message from Robert Bellow, MD sent at 12/13/2015  9:11 PM EDT ----- Please notify report is good. Does need a repeat exam in five years.  ----- Message -----    From: [facility]    Sent: 12/13/2015   6:31 PM      To: Robert Bellow, MD

## 2015-12-18 NOTE — Telephone Encounter (Signed)
Notified patient as instructed, patient pleased. Discussed follow-up appointments, patient agrees. Patient placed in recalls.   

## 2016-01-09 ENCOUNTER — Ambulatory Visit: Payer: Self-pay | Admitting: General Surgery

## 2016-01-15 ENCOUNTER — Ambulatory Visit: Payer: Self-pay | Admitting: General Surgery

## 2016-02-06 NOTE — Telephone Encounter (Signed)
error 

## 2016-08-05 ENCOUNTER — Encounter: Payer: BLUE CROSS/BLUE SHIELD | Admitting: Family Medicine

## 2016-10-08 LAB — LIPID PANEL
CHOLESTEROL: 196 mg/dL (ref 0–200)
HDL: 46 mg/dL (ref 35–70)
LDL Cholesterol: 112 mg/dL
TRIGLYCERIDES: 190 mg/dL — AB (ref 40–160)

## 2016-10-08 LAB — BASIC METABOLIC PANEL
BUN: 19 mg/dL (ref 4–21)
CREATININE: 1 mg/dL (ref 0.6–1.3)
Glucose: 83 mg/dL
POTASSIUM: 4.6 mmol/L (ref 3.4–5.3)
Sodium: 142 mmol/L (ref 137–147)

## 2016-10-08 LAB — CBC AND DIFFERENTIAL
HCT: 47 % (ref 41–53)
Hemoglobin: 16.1 g/dL (ref 13.5–17.5)
PLATELETS: 175 10*3/uL (ref 150–399)

## 2016-10-08 LAB — HEMOGLOBIN A1C: Hemoglobin A1C: 5.4

## 2016-10-08 LAB — TSH: TSH: 2.6 u[IU]/mL (ref 0.41–5.90)

## 2016-10-08 LAB — HEPATIC FUNCTION PANEL
ALT: 30 U/L (ref 10–40)
AST: 24 U/L (ref 14–40)
Alkaline Phosphatase: 62 U/L (ref 25–125)

## 2016-10-08 LAB — VITAMIN D 25 HYDROXY (VIT D DEFICIENCY, FRACTURES): Vit D, 25-Hydroxy: 24.6

## 2016-10-11 ENCOUNTER — Encounter: Payer: Self-pay | Admitting: Family Medicine

## 2016-11-12 ENCOUNTER — Emergency Department
Admission: EM | Admit: 2016-11-12 | Discharge: 2016-11-12 | Disposition: A | Discharge status: Home / Self Care | Payer: BLUE CROSS/BLUE SHIELD | Attending: Emergency Medicine | Admitting: Emergency Medicine

## 2016-11-12 ENCOUNTER — Encounter: Payer: Self-pay | Admitting: *Deleted

## 2016-11-12 DIAGNOSIS — Y9389 Activity, other specified: Secondary | ICD-10-CM | POA: Diagnosis not present

## 2016-11-12 DIAGNOSIS — W228XXA Striking against or struck by other objects, initial encounter: Secondary | ICD-10-CM | POA: Diagnosis not present

## 2016-11-12 DIAGNOSIS — Z23 Encounter for immunization: Secondary | ICD-10-CM | POA: Insufficient documentation

## 2016-11-12 DIAGNOSIS — Y929 Unspecified place or not applicable: Secondary | ICD-10-CM | POA: Diagnosis not present

## 2016-11-12 DIAGNOSIS — Y999 Unspecified external cause status: Secondary | ICD-10-CM | POA: Diagnosis not present

## 2016-11-12 DIAGNOSIS — S0181XA Laceration without foreign body of other part of head, initial encounter: Secondary | ICD-10-CM | POA: Insufficient documentation

## 2016-11-12 MED ORDER — TETANUS-DIPHTH-ACELL PERTUSSIS 5-2.5-18.5 LF-MCG/0.5 IM SUSP
INTRAMUSCULAR | Status: AC
  Administered 2016-11-12: 0.5 mL via INTRAMUSCULAR
  Filled 2016-11-12: qty 0.5

## 2016-11-12 MED ORDER — TETANUS-DIPHTH-ACELL PERTUSSIS 5-2.5-18.5 LF-MCG/0.5 IM SUSP
0.5000 mL | Freq: Once | INTRAMUSCULAR | Status: AC
  Administered 2016-11-12: 0.5 mL via INTRAMUSCULAR

## 2016-11-12 NOTE — ED Provider Notes (Signed)
[facility] Emergency Department Provider Note  ____________________________________________  Time seen: Approximately 9:45 PM  I have reviewed the triage vital signs and the nursing notes.   HISTORY  Chief Complaint Laceration   HPI Albert Carpenter is a 56 y.o. male who presents to the emergency department for evaluation of a laceration to his forehead.While putting a metal framed bed together tonight, he attempted to pull to the metal poles apart and when they gave way flew up and caught him in the forehead. He has a laceration to the right forehead. Tetanus shot is not up-to-date.  Past Medical History:  Diagnosis Date  . History of colon polyps   . Obesity, unspecified   . Screening for obesity     Patient Active Problem List   Diagnosis Date Noted  . History of colonic polyps 11/20/2015  . Special screening for malignant neoplasms, colon 11/20/2015  . Colon polyps 07/31/2015  . 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 Diverticulosis of colon 07/27/2015  . Abnormal liver enzymes 07/27/2015  . Back pain, thoracic 07/27/2015  . Adiposity 07/27/2015  . 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 Restless leg 07/27/2015  . Gallstone 10/12/2012    Past Surgical History:  Procedure Laterality Date  . CHOLECYSTECTOMY  2013  . COLONOSCOPY  01/08/11  . COLONOSCOPY WITH PROPOFOL N/A 12/12/2015   Procedure: COLONOSCOPY WITH PROPOFOL;  Surgeon: Robert Bellow, MD;  Location: [facility] ENDOSCOPY;  Service: Endoscopy;  Laterality: N/A;  . CYST REMOVAL LEG Left 2010  . KNEE SURGERY Bilateral 2000's  . SHOULDER SURGERY Right 2010  . SYNOVECTOMY     due to complex medical bilateral meniscus tear  . TONSILLECTOMY  kid    Prior to Admission medications   Not on File    Allergies Bee venom  Family History  Problem Relation Age of Onset  . Alzheimer's disease Mother   . Heart attack Father   . Obesity Father   . Hepatitis Sister   . Healthy Son   . Healthy  Son   . Healthy Daughter     Social History Social History  Substance Use Topics  . Smoking status: Never Smoker  . Smokeless tobacco: Never Used  . Alcohol use Yes     Comment: occasional    Review of Systems  Constitutional: Negative for recent illness.  Respiratory: Negative for shortness of breath. Musculoskeletal: Negative for pain. Skin: Positive for laceration to forehead. Neurological: Negative for headaches, focal weakness or numbness. ____________________________________________   PHYSICAL EXAM:  VITAL SIGNS: ED Triage Vitals  Enc Vitals Group     BP 11/12/16 2024 129/77     Pulse Rate 11/12/16 2024 67     Resp 11/12/16 2024 20     Temp 11/12/16 2024 98 F (36.7 C)     Temp Source 11/12/16 2024 Oral     SpO2 11/12/16 2024 100 %     Weight 11/12/16 2025 240 lb (108.9 kg)     Height 11/12/16 2025 5\' 11"  (1.803 m)     Head Circumference --      Peak Flow --      Pain Score 11/12/16 2023 5     Pain Loc --      Pain Edu? --      Excl. in [place]? --      Constitutional: Alert and oriented. Well appearing and in no acute distress. Eyes: Conjunctivae are normal. EOMI. Nose: No congestion/rhinnorhea. Mouth/Throat: Mucous membranes are moist.   Neck: No  stridor. Cardiovascular: Good peripheral circulation. Respiratory: Normal respiratory effort.  No retractions. Musculoskeletal: FROM throughout. Neurologic:  Normal speech and language. No gross focal neurologic deficits are appreciated. Skin:  1.25 cm superficial laceration to the right side of the forehead.  ____________________________________________   LABS (all labs ordered are listed, but only abnormal results are displayed)  Labs Reviewed - No data to display ____________________________________________  EKG   ____________________________________________  RADIOLOGY  Not indicated ____________________________________________   PROCEDURES  Procedure(s) performed: Wound cleaned with  Hibiclens and normal saline and closed with Dermabond. ____________________________________________   INITIAL IMPRESSION / ASSESSMENT AND PLAN / ED COURSE  56 year old male presenting to the emergency department for evaluation and closure of laceration after being struck in the forehead with a metal pole while attempting to pull them apart. No loss of consciousness. Wound was easily reapproximated with Dermabond and wound care instructions were discussed with the patient. He was advised to follow-up with the primary care provider for any symptom of concern or return to the emergency department if unable schedule an appointment.  Pertinent labs & imaging results that were available during my care of the patient were reviewed by me and considered in my medical decision making (see chart for details).   ____________________________________________   FINAL CLINICAL IMPRESSION(S) / ED DIAGNOSES  Final diagnoses:  Laceration of forehead, initial encounter    There are no discharge medications for this patient.   If controlled substance prescribed during this visit, 12 month history viewed on the [place] prior to issuing an initial prescription for Schedule II or III opiod.   Note:  This document was prepared using Dragon voice recognition software and may include unintentional dictation errors.    Victorino Dike, FNP 11/12/16 2237    Orbie Pyo, MD 11/13/16 [phone]

## 2016-11-12 NOTE — Discharge Instructions (Signed)
Do not get the sutured area wet for 24 hours. After 24 hours, shower/bathe as usual and pat the area dry. Do not apply ointments to the area until the glue has worn off. Afterward, apply Vitamin E cream or Mederma.  Keep the area covered with sunscreen as this area will burn more easily than the rest of your skin.

## 2016-11-12 NOTE — ED Triage Notes (Signed)
Pt cut his forehead on a bed frame, small laceration on forehead , no bleeding, no LOC

## 2016-11-12 NOTE — ED Notes (Signed)
Pt discharged to home.  Discharge instructions reviewed.  Verbalized understanding.  No questions or concerns at this time.  Teach back verified.  Pt in NAD.  No items left in ED.   

## 2017-07-04 ENCOUNTER — Encounter: Payer: Self-pay | Admitting: Physician Assistant

## 2017-07-04 ENCOUNTER — Ambulatory Visit: Payer: BLUE CROSS/BLUE SHIELD | Admitting: Physician Assistant

## 2017-07-04 ENCOUNTER — Ambulatory Visit
Admission: RE | Admit: 2017-07-04 | Discharge: 2017-07-04 | Disposition: A | Discharge status: Home / Self Care | Payer: BLUE CROSS/BLUE SHIELD | Source: Ambulatory Visit | Attending: Physician Assistant | Admitting: Physician Assistant

## 2017-07-04 ENCOUNTER — Telehealth: Payer: Self-pay | Admitting: Physician Assistant

## 2017-07-04 VITALS — BP 122/80 | HR 75 | Temp 98.3°F | Resp 16 | Wt 246.4 lb

## 2017-07-04 DIAGNOSIS — M2141 Flat foot [pes planus] (acquired), right foot: Secondary | ICD-10-CM

## 2017-07-04 DIAGNOSIS — M79671 Pain in right foot: Secondary | ICD-10-CM | POA: Diagnosis not present

## 2017-07-04 DIAGNOSIS — Z23 Encounter for immunization: Secondary | ICD-10-CM

## 2017-07-04 NOTE — Patient Instructions (Addendum)
Flat Feet, Adult Normally, a foot has a curve, called an arch, on its inner side. The arch creates a gap between the foot and the ground. Flat feet is a common condition in which one or both feet do not have an arch. What are the causes? This condition may be caused by:  Failure of a normal arch to develop during childhood.  An injury to tendons and ligaments in the foot, such as to the tendon that supports the arch (posterior tibial tendon).  Loose tendons or ligaments in the foot.  A wearing down of the arch over time.  Injury to bones in the foot.  An abnormality in the bones of the foot, called tarsal coalition. This happens when two or more bones in the foot are joined together (fused) before birth.  What increases the risk? This condition is more likely to develop in:  Females.  Adults age 21 or older.  People who: ? Have a family history of flat feet. ? Have a history of childhood flexible flatfoot. ? Are obese. ? Have diabetes. ? Have high blood pressure. ? Participate in high-impact sports. ? Have inflammatory arthritis. ? Have a history of broken (fractured) or dislocated bones in the foot.  What are the signs or symptoms? Symptoms of this condition include:  Pain or tightness along the bottom of the foot.  Foot pain that gets worse with activity.  Swelling of the inner side of the foot.  Swelling of the ankle.  Pain on the outer side of the ankle.  Changes in the way that you walk (gait).  Pronation. This is when the foot and ankle lean inward when you are standing.  Bony bumps on the top or inner side of the foot.  How is this diagnosed? This condition is diagnosed with a physical exam of your foot and ankle. Your health care provider may also:  Look at your shoes for patterns of wear on the soles.  Order imaging tests, such as X-rays, a CT scan, or an MRI.  Refer you to a health care provider who specializes in feet (podiatrist) or a physical  therapist.  How is this treated? This condition may be treated with:  Stretching exercises or physical therapy. This helps to increase range of motion and relieve pain.  A shoe insert (orthotic). This helps to support the arch of your foot. Orthotics can be purchased from a store or can be custom-made by your health care provider.  Wearing shoes with appropriate arch support. This is especially important for athletes.  Medicines. These may be prescribed to relieve pain.  An ankle brace, boot, or cast. These may be used to relieve pressure on your foot. You may be given crutches if walking is painful.  Surgery. This may be done to improve the alignment of your foot. This is only needed if your posterior tibial tendon is torn or if you have tarsal coalition.  Follow these instructions at home: Activity  Do any exercises as told by your health care provider.  If an activity causes pain, avoid it or try to find another activity that does not cause pain. General instructions  Wear orthotics and appropriate shoes as told by your health care provider.  Take over-the-counter and prescription medicines only as told by your health care provider.  Wear an ankle brace, boot, or cast as told by your health care provider.  Use crutches as told by your health care provider.  Keep all follow-up visits as told  by your health care provider. This is important. How is this prevented? To prevent the condition from getting worse:  Wear comfortable, supportive shoes that are appropriate for your activities.  Maintain a healthy weight.  Stay active in a way that your health care provider recommends. This will help to keep your feet flexible and strong.  Manage long-term (chronic) health conditions, such as diabetes, high blood pressure, and inflammatory arthritis.  Work with a health care provider if you have concerns about your feet or shoes.  Contact a health care provider if:  You have  pain in your foot or lower leg that gets worse or does not improve with medicine.  You have pain or difficulty when walking.  You have problems with your orthotics. Summary  Flat feet is a common condition in which one or both feet do not have a curve, called an arch, on the inner side.  Your health care provider may recommend a shoe insert (orthotic) or shoes with the appropriate arch support.  Other treatments may include stretching exercises or physical therapy, medicines to relieve pain, and wearing an ankle brace, boot, or cast.  Surgery may be done if you have a tear in the tendon that supports your arch (posterior tibial tendon) or if two or more of your foot bones were joined together (fused)  before birth (tarsal coalition). This information is not intended to replace advice given to you by your health care provider. Make sure you discuss any questions you have with your health care provider. Document Released: 04/14/2009 Document Revised: 08/28/2016 Document Reviewed: 08/28/2016 Elsevier Interactive Patient Education  2018 [place]. Peroneal Tendinopathy Peroneal tendinopathy is irritation of the tendons that pass behind your ankle (peroneal tendons). These tendons attach muscles in your foot to a bone on the side of your foot and underneath the arch of your foot. This condition can cause your peroneal tendons to get bigger and swell. What are the causes? This condition may be caused by:  Putting stress on your ankle over and over again (overuse injury).  A sudden injury that puts stress on your tendons, such as an ankle sprain.  What increases the risk? This condition is more likely to develop in:  People who have high arches.  Athletes who play sports that involve putting stress on the ankle over and over again. These sports include: ? Running. ? Dancing. ? Soccer. ? Basketball.  What are the signs or symptoms? Symptoms of this condition can start suddenly or  develop gradually. Symptoms include:  Pain in the back of the ankle, on the side of the foot, or in the arch of the foot.  Pain that gets worse with activity and better with rest.  Swelling.  Warmth.  Weakness in your foot or ankle.  How is this diagnosed? This condition may be diagnosed based on:  Your symptoms.  Your medical history.  A physical exam.  Imaging tests, such as: ? An X-ray or CT scan to check for bone injury. ? MRI or ultrasound to check for muscle or tendon injury.  During your physical exam, your health care provider may move your foot and ankle and test the strength of your leg muscles. How is this treated? This condition may be treated by:  Keeping your body weight off your ankle for several days.  Returning gradually to full activity gradually.  Putting ice on your ankle to reduce swelling.  Taking an anti-inflammatory pain medicine (NSAID).  Having medicine injected into your  tendon to reduce swelling.  Wearing a removable boot or brace for ankle support.  Doing range-of-motion exercises and strengthening exercises (physical therapy) when pain and swelling improve.  If the condition does not improve with treatment, or if a tendon or muscle is damaged, surgery may be needed. Follow these instructions at home: If you have a boot or brace:  Wear it as told by your health care provider. Remove it only as told by your health care provider.  Loosen it if your toes tingle, become numb, or turn cold and blue.  Do not let it get wet if it is not waterproof.  Keep it clean. Managing pain, stiffness, and swelling  If directed, apply ice to the injured area: ? Put ice in a plastic bag. ? Place a towel between your skin and the bag. ? Leave the ice on for 20 minutes, 2-3 times a day.  Take over-the-counter and prescription medicines only as told by your health care provider.  Raise (elevate) your ankle above the level of your heart when resting  if you have swelling. Activity  Do not use your ankle to support (bear) your full body weight until your health care provider says that you can.  Do not do activities that make pain or swelling worse.  Return to your normal activities as told by your health care provider. General instructions  Keep all follow-up visits as told by your health care provider. This is important. How is this prevented?  Wear supportive footwear that is appropriate for your athletic activity.  Avoid athletic activities that cause swelling or pain in your ankle or foot.  See your health care provider if you have pain or swelling that does not improve after a few days of rest.  Stop training if you develop pain or swelling.  If you start a new athletic activity, start gradually to build up your strength, endurance, and flexibility. Contact a health care provider if:  Your symptoms get worse.  Your symptoms do not improve in 2-4 weeks.  You develop new, unexplained symptoms. This information is not intended to replace advice given to you by your health care provider. Make sure you discuss any questions you have with your health care provider. Document Released: 06/17/2005 Document Revised: 02/20/2016 Document Reviewed: 05/06/2015 Elsevier Interactive Patient Education  [employer].

## 2017-07-04 NOTE — Telephone Encounter (Signed)
Pt would like to get results from x rays that were done today

## 2017-07-04 NOTE — Telephone Encounter (Signed)
Spoke with pt, informed of results.

## 2017-07-04 NOTE — Progress Notes (Signed)
Patient: Albert Carpenter Male    DOB: [birth year]   57 y.o.   MRN: [id number] Visit Date: 07/04/2017  Today's Provider: Mar Daring, PA-C   No chief complaint on file.  Subjective:     Patient is coming in to reestablish care with new provider. He used to be a Albert Carpenter patient. Patient is here today with c/o foot pain.  Leg Pain   The incident occurred more than 1 week ago (Over a month ago). There was no injury mechanism. The pain is present in the right ankle and right foot. The quality of the pain is described as shooting and stabbing. The pain is at a severity of 9/10 (when he walks the pain gets better). The pain is moderate. The pain has been fluctuating since onset. Pertinent negatives include no numbness or tingling. He reports no foreign bodies present. The symptoms are aggravated by weight bearing (when standing for a long time). He has tried ice (icy hot, ) for the symptoms. The treatment provided no relief.   He reports the pain started in the top of his right foot around the insertion point of the anterior tibial tendon and along where this tendon lies across the ankle joint. Pain then gradually moved from the top of the foot to more along the lateral aspect of the right foot just below the lateral malleolus. This is where he most often feels pain now. Occasionally radiates to the whole plantar aspect of the foot after long periods of standing.    Allergies  Allergen Reactions  . Bee Venom Swelling    No current outpatient medications on file.  Review of Systems  Constitutional: Negative.   Respiratory: Negative.   Cardiovascular: Negative.   Gastrointestinal: Negative.   Musculoskeletal: Positive for arthralgias, gait problem and myalgias (radiating into right calf). Negative for joint swelling.  Neurological: Negative for tingling, weakness and numbness.    Social History   Tobacco Use  . Smoking status: Never Smoker  . Smokeless tobacco: Never  Used  Substance Use Topics  . Alcohol use: Yes    Comment: occasional   Objective:   BP 122/80 (BP Location: Left Arm, Patient Position: Sitting, Cuff Size: Normal)   Pulse 75   Temp 98.3 F (36.8 C) (Oral)   Resp 16   Wt 246 lb 6.4 oz (111.8 kg)   BMI 34.37 kg/m    Physical Exam  Constitutional: He appears well-developed and well-nourished. No distress.  HENT:  Head: Normocephalic and atraumatic.  Neck: Normal range of motion. Neck supple.  Cardiovascular: Normal rate, regular rhythm and normal heart sounds. Exam reveals no gallop and no friction rub.  No murmur heard. Pulmonary/Chest: Effort normal and breath sounds normal. No respiratory distress. He has no wheezes. He has no rales.  Musculoskeletal:       Right ankle: Normal.       Right foot: Normal.       Feet:  5/5 strength  Skin: He is not diaphoretic.  Vitals reviewed.  CLINICAL DATA:  Right ankle pain for 1 month without known injury.  EXAM: RIGHT ANKLE - COMPLETE 3+ VIEW  COMPARISON:  None.  FINDINGS: There is no evidence of fracture, dislocation, or joint effusion. There is no evidence of arthropathy or other focal bone abnormality. Soft tissues are unremarkable.  IMPRESSION: Normal right ankle.   Electronically Signed   By: Marijo Conception, M.D.   On: 07/04/2017 12:24 CLINICAL DATA:  Pain.  No recent injury.  EXAM: RIGHT FOOT COMPLETE - 3+ VIEW  COMPARISON:  No prior .  FINDINGS: No acute bony or joint abnormality identified. No evidence of fracture or dislocation.  IMPRESSION: No acute abnormality.   Electronically Signed   By: Marcello Moores  Register   On: 07/04/2017 12:24    Assessment & Plan:     1. Right foot pain Xrays obtained and are normal. Suspect peroneal tendinopathy and possibly peroneal nerve compression with standing due to pes planus. Will refer to podiatry for further evaluation and possible consideration of orthotics. Continue conservative management with  RICE during severe times, epsom salt soaks, NSAIDs prn, may consider tumeric for inflammation instead of NSAIDs. He is to call if symptoms worsen.  - DG Foot Complete Right; Future - DG Ankle Complete Right; Future - Ambulatory referral to Podiatry  2. Pes planus of right foot See above medical treatment plan. - Ambulatory referral to Podiatry  3. Need for influenza vaccination Flu vaccine given today without complication. Patient sat upright for 15 minutes to check for adverse reaction before being released. - Flu Vaccine QUAD 36+ mos IM        Mar Daring, PA-C  [facility]

## 2017-07-21 ENCOUNTER — Encounter: Payer: Self-pay | Admitting: Physician Assistant

## 2017-07-21 ENCOUNTER — Ambulatory Visit (INDEPENDENT_AMBULATORY_CARE_PROVIDER_SITE_OTHER): Payer: BLUE CROSS/BLUE SHIELD | Admitting: Physician Assistant

## 2017-07-21 VITALS — BP 126/78 | Temp 98.0°F | Resp 16 | Ht 71.0 in | Wt 241.0 lb

## 2017-07-21 DIAGNOSIS — Z125 Encounter for screening for malignant neoplasm of prostate: Secondary | ICD-10-CM

## 2017-07-21 DIAGNOSIS — Z0000 Encounter for general adult medical examination without abnormal findings: Secondary | ICD-10-CM

## 2017-07-21 DIAGNOSIS — Z1159 Encounter for screening for other viral diseases: Secondary | ICD-10-CM

## 2017-07-21 DIAGNOSIS — R748 Abnormal levels of other serum enzymes: Secondary | ICD-10-CM | POA: Diagnosis not present

## 2017-07-21 DIAGNOSIS — Z1211 Encounter for screening for malignant neoplasm of colon: Secondary | ICD-10-CM

## 2017-07-21 DIAGNOSIS — Z114 Encounter for screening for human immunodeficiency virus [HIV]: Secondary | ICD-10-CM

## 2017-07-21 LAB — IFOBT (OCCULT BLOOD): IMMUNOLOGICAL FECAL OCCULT BLOOD TEST: NEGATIVE

## 2017-07-21 NOTE — Patient Instructions (Signed)

## 2017-07-21 NOTE — Progress Notes (Signed)
Patient: Albert Carpenter, Male    DOB: 1961/02/24, 57 y.o.   MRN: [id number] Visit Date: 07/21/2017  Today's Provider: Mar Daring, PA-C   Chief Complaint  Patient presents with  . Annual Exam   Subjective:    Annual physical exam Albert Carpenter is a 57 y.o. male who presents today for health maintenance and complete physical. He feels well. He reports exercising occasionally. He reports he is sleeping fairly well.    Review of Systems  Constitutional: Negative.   HENT: Negative.   Eyes: Positive for pain.  Respiratory: Negative.   Cardiovascular: Negative.   Gastrointestinal: Negative.   Endocrine: Negative.   Genitourinary: Negative.   Musculoskeletal: Negative.   Skin: Negative.   Allergic/Immunologic: Negative.   Neurological: Negative.   Hematological: Negative.   Psychiatric/Behavioral: Negative.     Social History      He  reports that  has never smoked. he has never used smokeless tobacco. He reports that he drinks alcohol. He reports that he does not use drugs.       Social History   Socioeconomic History  . Marital status: Married    Spouse name: None  . Number of children: None  . Years of education: None  . Highest education level: None  Social Needs  . Financial resource strain: None  . Food insecurity - worry: None  . Food insecurity - inability: None  . Transportation needs - medical: None  . Transportation needs - non-medical: None  Occupational History  . None  Tobacco Use  . Smoking status: Never Smoker  . Smokeless tobacco: Never Used  Substance and Sexual Activity  . Alcohol use: Yes    Comment: occasional  . Drug use: No  . Sexual activity: None  Other Topics Concern  . None  Social History Narrative  . None    No past medical history on file.   Patient Active Problem List   Diagnosis Date Noted  . 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 Diverticulosis of colon 07/27/2015  . Abnormal liver enzymes 07/27/2015    . Back pain, thoracic 07/27/2015  . Adiposity 07/27/2015  . 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 Restless leg 07/27/2015  . Gallstone 10/12/2012    Past Surgical History:  Procedure Laterality Date  . CHOLECYSTECTOMY  2013  . COLONOSCOPY  01/08/11  . COLONOSCOPY WITH PROPOFOL N/A 12/12/2015   Procedure: COLONOSCOPY WITH PROPOFOL;  Surgeon: Robert Bellow, MD;  Location: [facility] ENDOSCOPY;  Service: Endoscopy;  Laterality: N/A;  . CYST REMOVAL LEG Left 2010  . KNEE SURGERY Bilateral 2000's  . SHOULDER SURGERY Right 2010  . SYNOVECTOMY     due to complex medical bilateral meniscus tear  . TONSILLECTOMY  kid    Family History        Family Status  Relation Name Status  . Mother  Alive  . Father  Deceased at age 14       MI  . Sister  Deceased at age 4       hepatitis  . Son  Alive  . Son  Alive  . Daughter  Alive        His family history includes Alzheimer's disease in his mother; Healthy in his daughter, son, and son; Heart attack in his father; Hepatitis in his sister; Obesity in his father.     Allergies  Allergen Reactions  . Bee Venom Swelling    No current outpatient medications on  file.   Patient Care Team: Mar Daring, PA-C as PCP - General (Family Medicine) Christene Lye, MD (General Surgery)      Objective:   Vitals: BP 126/78 (BP Location: Left Arm, Patient Position: Sitting, Cuff Size: Large)   Temp 98 F (36.7 C)   Resp 16   Ht 5\' 11"  (1.803 m)   Wt 241 lb (109.3 kg)   BMI 33.61 kg/m    Vitals:   07/21/17 1344  BP: 126/78  Resp: 16  Temp: 98 F (36.7 C)  Weight: 241 lb (109.3 kg)  Height: 5\' 11"  (1.803 m)     Physical Exam  Constitutional: He is oriented to person, place, and time. He appears well-developed and well-nourished.  HENT:  Head: Normocephalic and atraumatic.  Right Ear: Hearing, tympanic membrane, external ear and ear canal normal.  Left Ear: Hearing, tympanic membrane, external ear and ear canal  normal.  Nose: Nose normal.  Mouth/Throat: Uvula is midline, oropharynx is clear and moist and mucous membranes are normal.  Eyes: Conjunctivae and EOM are normal. Pupils are equal, round, and reactive to light. Right eye exhibits no discharge.  Neck: Normal range of motion. Neck supple. Carotid bruit is not present. No tracheal deviation present. No thyromegaly present.  Cardiovascular: Normal rate, regular rhythm, normal heart sounds and intact distal pulses.  No murmur heard. Pulmonary/Chest: Effort normal and breath sounds normal. No respiratory distress. He has no wheezes. He has no rales. He exhibits no tenderness.  Abdominal: Soft. He exhibits no distension and no mass. There is no tenderness. There is no rebound and no guarding.  Genitourinary: Rectum normal, prostate normal and penis normal. Rectal exam shows guaiac negative stool.  Musculoskeletal: Normal range of motion. He exhibits no edema or tenderness.  Lymphadenopathy:    He has no cervical adenopathy.  Neurological: He is alert and oriented to person, place, and time. He has normal reflexes. No cranial nerve deficit. He exhibits normal muscle tone. Coordination normal.  Skin: Skin is warm and dry. No rash noted. No erythema.  Psychiatric: He has a normal mood and affect. His behavior is normal. Judgment and thought content normal.     Depression Screen PHQ 2/9 Scores 07/04/2017 07/31/2015  PHQ - 2 Score 0 0      Assessment & Plan:     Routine Health Maintenance and Physical Exam  Exercise Activities and Dietary recommendations Goals    None      Immunization History  Administered Date(s) Administered  . Influenza,inj,Quad PF,6+ Mos 07/04/2017  . Tdap 05/20/2011, 11/12/2016    Health Maintenance  Topic Date Due  . Hepatitis C Screening  1960-12-25  . HIV Screening  10/24/1975  . COLONOSCOPY  12/11/2025  . TETANUS/TDAP  11/13/2026  . INFLUENZA VACCINE  Completed     Discussed health benefits of  physical activity, and encouraged him to engage in regular exercise appropriate for his age and condition.    1. Annual physical exam Normal physical exam today. Will check labs as below and f/u pending lab results. If labs are stable and WNL he will not need to have these rechecked for one year at his next annual physical exam. He is to call the office in the meantime if he has any acute issue, questions or concerns. - CBC w/Diff/Platelet - Comprehensive Metabolic Panel (CMET) - TSH - HgB A1c - Lipid Profile  2. Colon cancer screening OC lite collected today and was negative. - IFOBT POC (occult bld, rslt in  office)  3. Prostate cancer screening Normal prostate exam. Will check labs as below and f/u pending results. - PSA  4. Abnormal liver enzymes Will check labs as below and f/u pending results.  5. Screening for HIV without presence of risk factors - HIV antibody (with reflex)  6. Need for hepatitis C screening test - Hepatitis C Antibody    Mar Daring, PA-C  [facility]

## 2017-07-22 ENCOUNTER — Telehealth: Payer: Self-pay

## 2017-07-22 LAB — COMPREHENSIVE METABOLIC PANEL
A/G RATIO: 2 (ref 1.2–2.2)
ALT: 26 IU/L (ref 0–44)
AST: 21 IU/L (ref 0–40)
Albumin: 4.4 g/dL (ref 3.5–5.5)
Alkaline Phosphatase: 66 IU/L (ref 39–117)
BUN/Creatinine Ratio: 17 (ref 9–20)
BUN: 16 mg/dL (ref 6–24)
Bilirubin Total: 0.6 mg/dL (ref 0.0–1.2)
CALCIUM: 8.9 mg/dL (ref 8.7–10.2)
CO2: 20 mmol/L (ref 20–29)
Chloride: 105 mmol/L (ref 96–106)
Creatinine, Ser: 0.95 mg/dL (ref 0.76–1.27)
GFR calc Af Amer: 103 mL/min/{1.73_m2} (ref >59)
GFR, EST NON AFRICAN AMERICAN: 89 mL/min/{1.73_m2} (ref >59)
GLOBULIN, TOTAL: 2.2 g/dL (ref 1.5–4.5)
Glucose: 94 mg/dL (ref 65–99)
POTASSIUM: 3.9 mmol/L (ref 3.5–5.2)
SODIUM: 142 mmol/L (ref 134–144)
TOTAL PROTEIN: 6.6 g/dL (ref 6.0–8.5)

## 2017-07-22 LAB — CBC WITH DIFFERENTIAL/PLATELET
BASOS: 0 %
Basophils Absolute: 0 10*3/uL (ref 0.0–0.2)
EOS (ABSOLUTE): 0.1 10*3/uL (ref 0.0–0.4)
Eos: 1 %
HEMATOCRIT: 48 % (ref 37.5–51.0)
Hemoglobin: 17.3 g/dL (ref 13.0–17.7)
IMMATURE GRANS (ABS): 0 10*3/uL (ref 0.0–0.1)
IMMATURE GRANULOCYTES: 0 %
Lymphocytes Absolute: 1.7 10*3/uL (ref 0.7–3.1)
Lymphs: 18 %
MCH: 31.2 pg (ref 26.6–33.0)
MCHC: 36 g/dL — ABNORMAL HIGH (ref 31.5–35.7)
MCV: 87 fL (ref 79–97)
MONOS ABS: 0.4 10*3/uL (ref 0.1–0.9)
Monocytes: 4 %
NEUTROS PCT: 77 %
Neutrophils Absolute: 7 10*3/uL (ref 1.4–7.0)
Platelets: 185 10*3/uL (ref 150–379)
RBC: 5.55 x10E6/uL (ref 4.14–5.80)
RDW: 14 % (ref 12.3–15.4)
WBC: 9.3 10*3/uL (ref 3.4–10.8)

## 2017-07-22 LAB — HEMOGLOBIN A1C
Est. average glucose Bld gHb Est-mCnc: 114 mg/dL
Hgb A1c MFr Bld: 5.6 % (ref 4.8–5.6)

## 2017-07-22 LAB — PSA: Prostate Specific Ag, Serum: 1.2 ng/mL (ref 0.0–4.0)

## 2017-07-22 LAB — TSH: TSH: 2.59 u[IU]/mL (ref 0.450–4.500)

## 2017-07-22 LAB — HEPATITIS C ANTIBODY: Hep C Virus Ab: <0.1 s/co ratio (ref 0.0–0.9)

## 2017-07-22 LAB — LIPID PANEL
CHOLESTEROL TOTAL: 185 mg/dL (ref 100–199)
Chol/HDL Ratio: 3.7 ratio (ref 0.0–5.0)
HDL: 50 mg/dL (ref >39)
LDL CALC: 106 mg/dL — AB (ref 0–99)
TRIGLYCERIDES: 146 mg/dL (ref 0–149)
VLDL Cholesterol Cal: 29 mg/dL (ref 5–40)

## 2017-07-22 LAB — HIV ANTIBODY (ROUTINE TESTING W REFLEX): HIV Screen 4th Generation wRfx: NONREACTIVE

## 2017-07-22 NOTE — Telephone Encounter (Signed)
Patient advised as below.  

## 2017-07-22 NOTE — Telephone Encounter (Signed)
[id number] 

## 2017-07-22 NOTE — Telephone Encounter (Signed)
-----   Message from Mar Daring, [place] sent at 07/22/2017  8:20 AM EST ----- Cholesterol has improved from labs 9 months ago and is practically within normal limits currently. Kidney and liver function is normal. Thyroid normal. Sugar normal. Blood count normal. HIV negative. Hep C negative. PSA normal.

## 2017-08-01 ENCOUNTER — Ambulatory Visit: Payer: BLUE CROSS/BLUE SHIELD | Admitting: Podiatry

## 2017-08-04 ENCOUNTER — Ambulatory Visit: Payer: Self-pay | Admitting: Physician Assistant

## 2017-08-26 ENCOUNTER — Encounter: Payer: Self-pay | Admitting: Podiatry

## 2017-08-26 ENCOUNTER — Ambulatory Visit: Payer: BLUE CROSS/BLUE SHIELD

## 2017-08-26 ENCOUNTER — Ambulatory Visit: Payer: BLUE CROSS/BLUE SHIELD | Admitting: Podiatry

## 2017-08-26 DIAGNOSIS — M659 Synovitis and tenosynovitis, unspecified: Secondary | ICD-10-CM

## 2017-08-26 DIAGNOSIS — M2140 Flat foot [pes planus] (acquired), unspecified foot: Secondary | ICD-10-CM

## 2017-08-26 MED ORDER — MELOXICAM 15 MG PO TABS: 15.0000 mg | ORAL_TABLET | Freq: Every day | ORAL | 3 refills | Status: DC

## 2017-08-26 NOTE — Progress Notes (Signed)
   Subjective:    Patient ID: Albert Carpenter, male    DOB: 1960-10-24, 57 y.o.   MRN: [id number]  HPI    Review of Systems  All other systems reviewed and are negative.      Objective:   Physical Exam        Assessment & Plan:

## 2017-08-28 ENCOUNTER — Telehealth: Payer: Self-pay | Admitting: *Deleted

## 2017-08-28 NOTE — Telephone Encounter (Signed)
Pt states he would like to speak with Dr. Amalia Hailey about his ankle.

## 2017-08-28 NOTE — Progress Notes (Signed)
   HPI: 57 year old male presenting today as a new patient with a chief complaint of anterior and lateral right ankle pain that began two months ago. He states the pain radiates up the calf of the RLE. He has not done anything to treat the symptoms. There are no modifying factors noted. He denies any current pain. Patient is here for further evaluation and treatment.   No past medical history on file.   Physical Exam: General: The patient is alert and oriented x3 in no acute distress.  Dermatology: Skin is warm, dry and supple bilateral lower extremities. Negative for open lesions or macerations.  Vascular: Palpable pedal pulses bilaterally. No edema or erythema noted. Capillary refill within normal limits.  Neurological: Epicritic and protective threshold grossly intact bilaterally.   Musculoskeletal Exam: Range of motion within normal limits to all pedal and ankle joints bilateral. Muscle strength 5/5 in all groups bilateral.   Assessment: - right ankle synovitis - resolved   Plan of Care:  - Patient evaluated. X-Rays from 07/04/17 in Epic reviewed.  - Prescription for Mobic provided to patient.  - Continue wearing good shoe gear. - Return to clinic as needed.    Edrick Kins, DPM [facility]  Dr. Edrick Kins, DPM    [address], [place] [address]                Office [phone]  Fax [phone]

## 2017-08-29 NOTE — Telephone Encounter (Signed)
I left message for patient to return call.

## 2017-11-27 ENCOUNTER — Other Ambulatory Visit: Payer: Self-pay

## 2017-11-27 MED ORDER — MELOXICAM 15 MG PO TABS: 15.0000 mg | ORAL_TABLET | Freq: Every day | ORAL | 3 refills | Status: DC

## 2017-11-27 NOTE — Telephone Encounter (Signed)
Pharmacy refill request for Meloxicam.  Per Dr. Evans, ok to refill.   Script has been sent to pharmacy 

## 2018-02-03 ENCOUNTER — Other Ambulatory Visit
Admission: RE | Admit: 2018-02-03 | Discharge: 2018-02-03 | Disposition: A | Discharge status: Home / Self Care | Payer: BC Managed Care – PPO | Source: Ambulatory Visit | Attending: Internal Medicine | Admitting: Internal Medicine

## 2018-02-03 ENCOUNTER — Telehealth: Payer: Self-pay

## 2018-02-03 DIAGNOSIS — M79605 Pain in left leg: Secondary | ICD-10-CM | POA: Insufficient documentation

## 2018-02-03 LAB — FIBRIN DERIVATIVES D-DIMER (ARMC ONLY): Fibrin derivatives D-dimer (ARMC): 204.93 ng/mL (FEU) (ref 0.00–499.00)

## 2018-02-03 NOTE — Telephone Encounter (Signed)
There is another phone note stating patient was advised to go to urgent care

## 2018-02-03 NOTE — Telephone Encounter (Signed)
Patient called saying that he has developed severe pain in his leg that started yesterday afternoon. He reports that the pain starts in his thigh and radiates to his lower leg. He describes it as a throbbing, burning sensation. He denies any injuries. He can bear weight on it. He has not taken anything OTC for his symptoms. He is requesting to see Tawanna Sat, but advised she does not have an appt until the next 2 days. Does Tawanna Sat have any recommendations until patient can get in for an appt?  Appt has not been scheduled yet. Wanted to advise her of symptoms first. Please advise. Thanks!

## 2018-02-03 NOTE — Telephone Encounter (Signed)
Received the after hours paper phone call and called patient back regarding his leg pain. Patient reports it started yesterday. Reports that he gets a sharp shooting pain in left inner thigh down to his knee and lower calf area. Reports that the pain is throbbing constant and some swelling in his ankle and feels heavy. No other symptoms. Treatment tried heating pad. Per patient today he still has the pain but is better with walking. Per Dr.B patient to go to the urgent care patient agreed and reports he is going to go to [facility] urgent care.   FYI: patient reports he spoke with someone this morning and was offered an appointment with Mikki Santee. Didn't see patient in scheduled or phone note when checked. Advised patient to go to the urgent care to rule out the blood clot and call back for a follow-up if need it.  Thanks,  -Chelsea Pedretti

## 2018-07-20 NOTE — Progress Notes (Signed)
Patient: Albert Carpenter, Male    DOB: 12/05/1960, 58 y.o.   MRN: [id number] Visit Date: 07/22/2018  Today's Provider: Mar Daring, PA-C   Chief Complaint  Patient presents with  . Annual Exam   Subjective:    Annual physical exam Albert Carpenter is a 58 y.o. male who presents today for health maintenance and complete physical. He feels well. He reports exercising none. He reports he is sleeping well. -----------------------------------------------------------------   Review of Systems  Constitutional: Negative.   HENT: Negative.   Eyes: Negative.   Respiratory: Negative.   Cardiovascular: Negative.   Gastrointestinal: Negative.   Endocrine: Negative.   Genitourinary: Negative.   Musculoskeletal: Negative.   Skin: Negative.   Allergic/Immunologic: Negative.   Neurological: Negative.   Hematological: Negative.   Psychiatric/Behavioral: Negative.     Social History      He  reports that he has never smoked. He has never used smokeless tobacco. He reports current alcohol use. He reports that he does not use drugs.       Social History   Socioeconomic History  . Marital status: Married    Spouse name: Not on file  . Number of children: Not on file  . Years of education: Not on file  . Highest education level: Not on file  Occupational History  . Not on file  Social Needs  . Financial resource strain: Not on file  . Food insecurity:    Worry: Not on file    Inability: Not on file  . Transportation needs:    Medical: Not on file    Non-medical: Not on file  Tobacco Use  . Smoking status: Never Smoker  . Smokeless tobacco: Never Used  Substance and Sexual Activity  . Alcohol use: Yes    Comment: occasional  . Drug use: No  . Sexual activity: Not on file  Lifestyle  . Physical activity:    Days per week: Not on file    Minutes per session: Not on file  . Stress: Not on file  Relationships  . Social connections:    Talks on phone:  Not on file    Gets together: Not on file    Attends religious service: Not on file    Active member of club or organization: Not on file    Attends meetings of clubs or organizations: Not on file    Relationship status: Not on file  Other Topics Concern  . Not on file  Social History Narrative  . Not on file    History reviewed. No pertinent past medical history.   Patient Active Problem List   Diagnosis Date Noted  . 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 Diverticulosis of colon 07/27/2015  . Abnormal liver enzymes 07/27/2015  . Back pain, thoracic 07/27/2015  . Adiposity 07/27/2015  . 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 Restless leg 07/27/2015  . Gallstone 10/12/2012    Past Surgical History:  Procedure Laterality Date  . CHOLECYSTECTOMY  2013  . COLONOSCOPY  01/08/11  . COLONOSCOPY WITH PROPOFOL N/A 12/12/2015   Procedure: COLONOSCOPY WITH PROPOFOL;  Surgeon: Robert Bellow, MD;  Location: [facility] ENDOSCOPY;  Service: Endoscopy;  Laterality: N/A;  . CYST REMOVAL LEG Left 2010  . KNEE SURGERY Bilateral 2000's  . SHOULDER SURGERY Right 2010  . SYNOVECTOMY     due to complex medical bilateral meniscus tear  . TONSILLECTOMY  kid    Family History  Family Status  Relation Name Status  . Mother  Alive  . Father  Deceased at age 27       MI  . Sister  Deceased at age 20       hepatitis  . Son  Alive  . Son  Alive  . Daughter  Alive        His family history includes Alzheimer's disease in his mother; Healthy in his daughter, son, and son; Heart attack in his father; Hepatitis in his sister; Obesity in his father.      Allergies  Allergen Reactions  . Bee Venom Swelling     Current Outpatient Medications:  .  meloxicam (MOBIC) 15 MG tablet, Take 1 tablet (15 mg total) by mouth daily. (Patient not taking: Reported on 07/22/2018), Disp: 30 tablet, Rfl: 3   Patient Care Team: Mar Daring, PA-C as PCP - General (Family Medicine) Christene Lye, MD (General Surgery)      Objective:   Vitals: BP 110/70 (BP Location: Left Arm, Patient Position: Sitting, Cuff Size: Large)   Pulse 70   Temp 98.1 F (36.7 C) (Oral)   Resp 16   Ht 5\' 11"  (1.803 m)   Wt 240 lb (108.9 kg)   SpO2 94%   BMI 33.47 kg/m    Vitals:   07/22/18 0918  BP: 110/70  Pulse: 70  Resp: 16  Temp: 98.1 F (36.7 C)  TempSrc: Oral  SpO2: 94%  Weight: 240 lb (108.9 kg)  Height: 5\' 11"  (1.803 m)     Physical Exam Constitutional:      Appearance: He is well-developed.  HENT:     Head: Normocephalic and atraumatic.     Right Ear: Tympanic membrane, ear canal and external ear normal.     Left Ear: Tympanic membrane, ear canal and external ear normal.     Nose: Nose normal.     Mouth/Throat:     Mouth: Mucous membranes are moist.     Pharynx: No oropharyngeal exudate or posterior oropharyngeal erythema.  Eyes:     General:        Right eye: No discharge.     Conjunctiva/sclera: Conjunctivae normal.     Pupils: Pupils are equal, round, and reactive to light.  Neck:     Musculoskeletal: Normal range of motion and neck supple.     Thyroid: No thyromegaly.     Vascular: No carotid bruit.     Trachea: No tracheal deviation.  Cardiovascular:     Rate and Rhythm: Normal rate and regular rhythm.     Heart sounds: Normal heart sounds. No murmur.  Pulmonary:     Effort: Pulmonary effort is normal. No respiratory distress.     Breath sounds: Normal breath sounds. No wheezing or rales.  Chest:     Chest wall: No tenderness.  Abdominal:     General: There is no distension.     Palpations: Abdomen is soft. There is no mass.     Tenderness: There is no abdominal tenderness. There is no guarding or rebound.  Musculoskeletal: Normal range of motion.        General: No tenderness.  Lymphadenopathy:     Cervical: No cervical adenopathy.  Skin:    General: Skin is warm and dry.     Findings: No erythema or rash.  Neurological:     Mental Status: He is  alert and oriented to person, place, and time.     Cranial Nerves: No cranial  nerve deficit.     Motor: No abnormal muscle tone.     Coordination: Coordination normal.     Deep Tendon Reflexes: Reflexes are normal and symmetric. Reflexes normal.  Psychiatric:        Behavior: Behavior normal.        Thought Content: Thought content normal.        Judgment: Judgment normal.      Depression Screen PHQ 2/9 Scores 07/22/2018 07/04/2017 07/31/2015  PHQ - 2 Score 0 0 0  PHQ- 9 Score 0 - -      Assessment & Plan:     Routine Health Maintenance and Physical Exam  Exercise Activities and Dietary recommendations Goals   None     Immunization History  Administered Date(s) Administered  . Influenza,inj,Quad PF,6+ Mos 07/04/2017  . Tdap 05/20/2011, 11/12/2016    Health Maintenance  Topic Date Due  . COLONOSCOPY  12/11/2025  . TETANUS/TDAP  11/13/2026  . INFLUENZA VACCINE  Completed  . Hepatitis C Screening  Completed  . HIV Screening  Completed     Discussed health benefits of physical activity, and encouraged him to engage in regular exercise appropriate for his age and condition.    1. Annual physical exam Normal physical exam today. Will check labs as below and f/u pending lab results. If labs are stable and WNL he will not need to have these rechecked for one year at his next annual physical exam. He is to call the office in the meantime if he has any acute issue, questions or concerns. - CBC w/Diff/Platelet - Comprehensive Metabolic Panel (CMET) - TSH - Lipid Profile - HgB A1c  2. Prostate cancer screening Will check labs as below and f/u pending results. - PSA  3. Benign prostatic hyperplasia with nocturia Starting to have some nocturia, dribbling and decreased stream. Will start Flomax as below. He is to call if not tolerating or not working. Will check PSA as below and f/u pending results. If elevated will refer to Urology.  - tamsulosin (FLOMAX) 0.4 MG CAPS  capsule; Take 1 capsule (0.4 mg total) by mouth daily.  Dispense: 90 capsule; Refill: 3 - PSA  4. Class 1 obesity due to excess calories with serious comorbidity and body mass index (BMI) of 33.0 to 33.9 in adult Counseled patient on healthy lifestyle modifications including dieting and exercise.  - CBC w/Diff/Platelet - Comprehensive Metabolic Panel (CMET) - Lipid Profile - HgB A1c  5. Abnormal liver enzymes Diet controlled. Will check labs as below and f/u pending results. - Comprehensive Metabolic Panel (CMET)  6. Family history of cardiovascular disease EKG today shows NSR rate of 63, no ST changes.  - EKG 12-Lead  7. Encounter for lipid screening for cardiovascular disease Will check labs as below and f/u pending results. - Comprehensive Metabolic Panel (CMET) - Lipid Profile  8. Thyroid disorder screen Will check labs as below and f/u pending results. - TSH  9. Diabetes mellitus screening Will check labs as below and f/u pending results. - Comprehensive Metabolic Panel (CMET) - HgB A1c  10. Need for shingles vaccine Shingrix Vaccine #1 given to patient without complications. Patient sat for 15 minutes after administration and was tolerated well without adverse effects. Return in 2 months for second.  - Varicella-zoster vaccine IM (Shingrix)  --------------------------------------------------------------------    Mar Daring, PA-C  [facility]

## 2018-07-22 ENCOUNTER — Ambulatory Visit (INDEPENDENT_AMBULATORY_CARE_PROVIDER_SITE_OTHER): Payer: BC Managed Care – PPO | Admitting: Physician Assistant

## 2018-07-22 ENCOUNTER — Encounter: Payer: Self-pay | Admitting: Physician Assistant

## 2018-07-22 VITALS — BP 110/70 | HR 70 | Temp 98.1°F | Resp 16 | Ht 71.0 in | Wt 240.0 lb

## 2018-07-22 DIAGNOSIS — Z13220 Encounter for screening for lipoid disorders: Secondary | ICD-10-CM

## 2018-07-22 DIAGNOSIS — Z131 Encounter for screening for diabetes mellitus: Secondary | ICD-10-CM

## 2018-07-22 DIAGNOSIS — N401 Enlarged prostate with lower urinary tract symptoms: Secondary | ICD-10-CM

## 2018-07-22 DIAGNOSIS — Z23 Encounter for immunization: Secondary | ICD-10-CM

## 2018-07-22 DIAGNOSIS — Z8249 Family history of ischemic heart disease and other diseases of the circulatory system: Secondary | ICD-10-CM

## 2018-07-22 DIAGNOSIS — Z136 Encounter for screening for cardiovascular disorders: Secondary | ICD-10-CM

## 2018-07-22 DIAGNOSIS — E6609 Other obesity due to excess calories: Secondary | ICD-10-CM

## 2018-07-22 DIAGNOSIS — Z0000 Encounter for general adult medical examination without abnormal findings: Secondary | ICD-10-CM | POA: Diagnosis not present

## 2018-07-22 DIAGNOSIS — Z6833 Body mass index (BMI) 33.0-33.9, adult: Secondary | ICD-10-CM

## 2018-07-22 DIAGNOSIS — Z125 Encounter for screening for malignant neoplasm of prostate: Secondary | ICD-10-CM | POA: Diagnosis not present

## 2018-07-22 DIAGNOSIS — Z1329 Encounter for screening for other suspected endocrine disorder: Secondary | ICD-10-CM

## 2018-07-22 DIAGNOSIS — R748 Abnormal levels of other serum enzymes: Secondary | ICD-10-CM

## 2018-07-22 DIAGNOSIS — R351 Nocturia: Secondary | ICD-10-CM

## 2018-07-22 MED ORDER — TAMSULOSIN HCL 0.4 MG PO CAPS: 0.4000 mg | ORAL_CAPSULE | Freq: Every day | ORAL | 3 refills | Status: DC

## 2018-07-22 NOTE — Patient Instructions (Signed)

## 2018-07-24 LAB — COMPREHENSIVE METABOLIC PANEL
ALK PHOS: 68 IU/L (ref 39–117)
ALT: 29 IU/L (ref 0–44)
AST: 18 IU/L (ref 0–40)
Albumin/Globulin Ratio: 1.9 (ref 1.2–2.2)
Albumin: 4.1 g/dL (ref 3.8–4.9)
BILIRUBIN TOTAL: 0.7 mg/dL (ref 0.0–1.2)
BUN / CREAT RATIO: 16 (ref 9–20)
BUN: 17 mg/dL (ref 6–24)
CHLORIDE: 103 mmol/L (ref 96–106)
CO2: 23 mmol/L (ref 20–29)
Calcium: 9 mg/dL (ref 8.7–10.2)
Creatinine, Ser: 1.08 mg/dL (ref 0.76–1.27)
GFR calc Af Amer: 88 mL/min/{1.73_m2} (ref >59)
GFR calc non Af Amer: 76 mL/min/{1.73_m2} (ref >59)
Globulin, Total: 2.2 g/dL (ref 1.5–4.5)
Glucose: 92 mg/dL (ref 65–99)
POTASSIUM: 5 mmol/L (ref 3.5–5.2)
Sodium: 141 mmol/L (ref 134–144)
Total Protein: 6.3 g/dL (ref 6.0–8.5)

## 2018-07-24 LAB — CBC WITH DIFFERENTIAL/PLATELET
Basophils Absolute: 0.1 10*3/uL (ref 0.0–0.2)
Basos: 1 %
EOS (ABSOLUTE): 0.2 10*3/uL (ref 0.0–0.4)
Eos: 2 %
Hematocrit: 49.2 % (ref 37.5–51.0)
Hemoglobin: 16.4 g/dL (ref 13.0–17.7)
Immature Grans (Abs): 0.1 10*3/uL (ref 0.0–0.1)
Immature Granulocytes: 1 %
LYMPHS ABS: 1.5 10*3/uL (ref 0.7–3.1)
Lymphs: 17 %
MCH: 29.8 pg (ref 26.6–33.0)
MCHC: 33.3 g/dL (ref 31.5–35.7)
MCV: 90 fL (ref 79–97)
MONOS ABS: 0.5 10*3/uL (ref 0.1–0.9)
Monocytes: 6 %
NEUTROS ABS: 6.6 10*3/uL (ref 1.4–7.0)
Neutrophils: 73 %
PLATELETS: 196 10*3/uL (ref 150–450)
RBC: 5.5 x10E6/uL (ref 4.14–5.80)
RDW: 13.1 % (ref 11.6–15.4)
WBC: 8.9 10*3/uL (ref 3.4–10.8)

## 2018-07-24 LAB — LIPID PANEL
Chol/HDL Ratio: 4 ratio (ref 0.0–5.0)
Cholesterol, Total: 198 mg/dL (ref 100–199)
HDL: 49 mg/dL (ref >39)
LDL Calculated: 118 mg/dL — ABNORMAL HIGH (ref 0–99)
Triglycerides: 157 mg/dL — ABNORMAL HIGH (ref 0–149)
VLDL Cholesterol Cal: 31 mg/dL (ref 5–40)

## 2018-07-24 LAB — HEMOGLOBIN A1C
Est. average glucose Bld gHb Est-mCnc: 117 mg/dL
Hgb A1c MFr Bld: 5.7 % — ABNORMAL HIGH (ref 4.8–5.6)

## 2018-07-24 LAB — PSA: PROSTATE SPECIFIC AG, SERUM: 1.6 ng/mL (ref 0.0–4.0)

## 2018-07-24 LAB — TSH: TSH: 2.59 u[IU]/mL (ref 0.450–4.500)

## 2018-07-27 ENCOUNTER — Telehealth: Payer: Self-pay

## 2018-07-27 NOTE — Telephone Encounter (Signed)
-----   Message from Mar Daring, [place] sent at 07/24/2018  5:11 PM EST ----- Blood count is normal. Kidney and liver function normal. Thyroid is normal. Cholesterol up slightly from last year but not needed to take cholesterol lowering medication at this time. A1c also up slightly to 5.7, was 5.6 last year. Continue healthy lifestyle modifications. PSA normal.

## 2018-07-27 NOTE — Telephone Encounter (Signed)
Patient was advised.  

## 2018-08-28 ENCOUNTER — Telehealth: Payer: Self-pay | Admitting: Physician Assistant

## 2018-08-28 NOTE — Telephone Encounter (Signed)
Pt called wanting a list of his past surgeries.  He needs this for life insurance policies.  He said he knows that he has given up the information before when he has had surgeries.  His CB#  [phone]  Thanks Con Memos

## 2018-08-28 NOTE — Telephone Encounter (Signed)
LMTCB-List of past surgeries placed up front ready for pick up.

## 2018-09-30 ENCOUNTER — Ambulatory Visit: Payer: BC Managed Care – PPO | Admitting: Physician Assistant

## 2018-12-02 ENCOUNTER — Ambulatory Visit: Payer: BC Managed Care – PPO | Admitting: Physician Assistant

## 2018-12-02 ENCOUNTER — Other Ambulatory Visit: Payer: Self-pay

## 2018-12-02 DIAGNOSIS — Z23 Encounter for immunization: Secondary | ICD-10-CM

## 2018-12-02 NOTE — Progress Notes (Signed)
Shingrix Vaccine #2 given to patient without complications. Patient sat for 15 minutes after administration and was tolerated well without adverse effects. 

## 2018-12-07 NOTE — Progress Notes (Signed)
Shingrix #2 Vaccine given to patient without complications. Patient sat for 15 minutes after administration and was tolerated well without adverse effects.

## 2019-02-04 ENCOUNTER — Encounter: Payer: Self-pay | Admitting: General Surgery

## 2019-02-07 ENCOUNTER — Other Ambulatory Visit: Payer: Self-pay

## 2019-02-07 ENCOUNTER — Encounter: Payer: Self-pay | Admitting: Emergency Medicine

## 2019-02-07 ENCOUNTER — Emergency Department
Admission: EM | Admit: 2019-02-07 | Discharge: 2019-02-07 | Disposition: A | Discharge status: Home / Self Care | Payer: BC Managed Care – PPO | Attending: Emergency Medicine | Admitting: Emergency Medicine

## 2019-02-07 DIAGNOSIS — Y999 Unspecified external cause status: Secondary | ICD-10-CM | POA: Diagnosis not present

## 2019-02-07 DIAGNOSIS — Y9389 Activity, other specified: Secondary | ICD-10-CM | POA: Insufficient documentation

## 2019-02-07 DIAGNOSIS — S61227A Laceration with foreign body of left little finger without damage to nail, initial encounter: Secondary | ICD-10-CM

## 2019-02-07 DIAGNOSIS — Y9289 Other specified places as the place of occurrence of the external cause: Secondary | ICD-10-CM | POA: Diagnosis not present

## 2019-02-07 DIAGNOSIS — S61217A Laceration without foreign body of left little finger without damage to nail, initial encounter: Secondary | ICD-10-CM | POA: Insufficient documentation

## 2019-02-07 DIAGNOSIS — W293XXA Contact with powered garden and outdoor hand tools and machinery, initial encounter: Secondary | ICD-10-CM | POA: Diagnosis not present

## 2019-02-07 DIAGNOSIS — Z79899 Other long term (current) drug therapy: Secondary | ICD-10-CM | POA: Insufficient documentation

## 2019-02-07 MED ORDER — LIDOCAINE HCL 1 % IJ SOLN
5.0000 mL | Freq: Once | INTRAMUSCULAR | Status: AC
  Administered 2019-02-07: 5 mL
  Filled 2019-02-07: qty 10

## 2019-02-07 MED ORDER — CEPHALEXIN 500 MG PO CAPS: 500.0000 mg | ORAL_CAPSULE | Freq: Three times a day (TID) | ORAL | 0 refills | Status: AC

## 2019-02-07 NOTE — ED Notes (Signed)
Pt verbalized understanding of discharge instructions. NAD at this time. 

## 2019-02-07 NOTE — ED Provider Notes (Signed)
[facility] Emergency Department Provider Note  ____________________________________________  Time seen: Approximately 3:59 PM  I have reviewed the triage vital signs and the nursing notes.   HISTORY  Chief Complaint Laceration    HPI Albert Carpenter is a 58 y.o. male presents to the emergency department with 2 lacerations sustained to the dorsal aspect of the left fifth digit accidentally using a chainsaw.  Lacerations are approximately 2 cm in length and 3 cm in length respectively.  Patient cannot recall his last tetanus shot.  He has been actively moving digit since injury occurred.  No numbness or tingling in the left hand.  No alleviating measures have been attempted.        History reviewed. No pertinent past medical history.  Patient Active Problem List   Diagnosis Date Noted  . 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 Diverticulosis of colon 07/27/2015  . Abnormal liver enzymes 07/27/2015  . Back pain, thoracic 07/27/2015  . Adiposity 07/27/2015  . 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 Restless leg 07/27/2015  . Gallstone 10/12/2012    Past Surgical History:  Procedure Laterality Date  . CHOLECYSTECTOMY  2013  . COLONOSCOPY  01/08/11  . COLONOSCOPY WITH PROPOFOL N/A 12/12/2015   Procedure: COLONOSCOPY WITH PROPOFOL;  Surgeon: Robert Bellow, MD;  Location: [facility] ENDOSCOPY;  Service: Endoscopy;  Laterality: N/A;  . CYST REMOVAL LEG Left 2010  . KNEE SURGERY Bilateral 2000's  . SHOULDER SURGERY Right 2010  . SYNOVECTOMY     due to complex medical bilateral meniscus tear  . TONSILLECTOMY  kid    Prior to Admission medications   Medication Sig Start Date End Date Taking? Authorizing Provider  cephALEXin (KEFLEX) 500 MG capsule Take 1 capsule (500 mg total) by mouth 3 (three) times daily for 7 days. 02/07/19 02/14/19  Lannie Fields, PA-C  tamsulosin (FLOMAX) 0.4 MG CAPS capsule Take 1 capsule (0.4 mg total) by mouth daily. 07/22/18   Mar Daring, PA-C    Allergies Bee venom  Family History  Problem Relation Age of Onset  . Alzheimer's disease Mother   . Heart attack Father   . Obesity Father   . Hepatitis Sister   . Healthy Son   . Healthy Son   . Healthy Daughter     Social History Social History   Tobacco Use  . Smoking status: Never Smoker  . Smokeless tobacco: Never Used  Substance Use Topics  . Alcohol use: Yes    Comment: occasional  . Drug use: No     Review of Systems  Constitutional: No fever/chills Eyes: No visual changes. No discharge ENT: No upper respiratory complaints. Cardiovascular: no chest pain. Respiratory: no cough. No SOB. Gastrointestinal: No abdominal pain.  No nausea, no vomiting.  No diarrhea.  No constipation. Musculoskeletal: Negative for musculoskeletal pain. Skin: Patient has lacerations.  Neurological: Negative for headaches, focal weakness or numbness.  ____________________________________________   PHYSICAL EXAM:  VITAL SIGNS: ED Triage Vitals  Enc Vitals Group     BP 02/07/19 1531 94/78     Pulse Rate 02/07/19 1529 76     Resp 02/07/19 1529 16     Temp 02/07/19 1529 98 F (36.7 C)     Temp Source 02/07/19 1529 Oral     SpO2 02/07/19 1529 97 %     Weight 02/07/19 1530 240 lb (108.9 kg)     Height 02/07/19 1530 5\' 11"  (1.803 m)     Head Circumference --  Peak Flow --      Pain Score 02/07/19 1529 5     Pain Loc --      Pain Edu? --      Excl. in [place]? --      Constitutional: Alert and oriented. Well appearing and in no acute distress. Eyes: Conjunctivae are normal. PERRL. EOMI. Head: Atraumatic.  Cardiovascular: Normal rate, regular rhythm. Normal S1 and S2.  Good peripheral circulation. Respiratory: Normal respiratory effort without tachypnea or retractions. Lungs CTAB. Good air entry to the bases with no decreased or absent breath sounds. Musculoskeletal: Full range of motion to all extremities. No gross deformities appreciated.  Patient  has no extensor tendon deficits appreciated with testing of the left fifth digit.  Capillary refill less than 2 seconds, left.  Palpable radial and ulnar pulses, left. Neurologic:  Normal speech and language. No gross focal neurologic deficits are appreciated.  Skin: Patient has 2 cm laceration and 3 cm laceration along the dorsal aspect of the left fifth digit.  Lacerations appear superficial in nature.  Depth appears to be to underlying dermis. Psychiatric: Mood and affect are normal. Speech and behavior are normal. Patient exhibits appropriate insight and judgement.   ____________________________________________   LABS (all labs ordered are listed, but only abnormal results are displayed)  Labs Reviewed - No data to display ____________________________________________  EKG   ____________________________________________  RADIOLOGY   No results found.  ____________________________________________    PROCEDURES  Procedure(s) performed:    Procedures  LACERATION REPAIR Performed by: Verne Carrow PA-S Authorized by: Lannie Fields Consent: Verbal consent obtained. Risks and benefits: risks, benefits and alternatives were discussed Consent given by: patient Patient identity confirmed: provided demographic data Prepped and Draped in normal sterile fashion Wound explored  Laceration Location: Left fifth digit   Laceration Length:  Proximal: 3 cm Distal: 2.5 cm  No Foreign Bodies seen or palpated  Anesthesia: local infiltration  Local anesthetic: lidocaine 1% without epinephrine  Anesthetic total: 3 ml  Irrigation method: syringe Amount of cleaning: standard  Skin closure: 4-0 Ethilon   Number of sutures:  Proximal: 6 Distal: 3   Technique: Simple Interrupted   Patient tolerance: Patient tolerated the procedure well with no immediate complications.   Medications  lidocaine (XYLOCAINE) 1 % (with pres) injection 5 mL (5 mLs Infiltration Given  02/07/19 1753)     ____________________________________________   INITIAL IMPRESSION / ASSESSMENT AND PLAN / ED COURSE  Pertinent labs & imaging results that were available during my care of the patient were reviewed by me and considered in my medical decision making (see chart for details).  Review of the [place] CSRS was performed in accordance of the [place] prior to dispensing any controlled drugs.           Assessment and Plan:  Lacerations 58 year old male presents to the emergency department with lacerations along the dorsal aspect of the left fifth digit.  Vital signs were stable at triage.  Patient had no flexor or extensor tendon deficits appreciated with testing.  Laceration repair occurred in the emergency department without complication.  Patient was advised to have external sutures removed by primary care in 7 days.  He was discharged with Keflex.  Patient was given a follow-up referral to hand surgeon, Dr. Peggye Ley.  All patient questions were answered.      ____________________________________________  FINAL CLINICAL IMPRESSION(S) / ED DIAGNOSES  Final diagnoses:  Laceration of left little finger with foreign body without damage to nail, initial encounter  NEW MEDICATIONS STARTED DURING THIS VISIT:  ED Discharge Orders         Ordered    cephALEXin (KEFLEX) 500 MG capsule  3 times daily     02/07/19 1701              This chart was dictated using voice recognition software/Dragon. Despite best efforts to proofread, errors can occur which can change the meaning. Any change was purely unintentional.    Lannie Fields, PA-C 02/07/19 Katherine Mantle, MD 02/07/19 2155

## 2019-02-07 NOTE — Discharge Instructions (Signed)
Keep wound clean and dry for the next 24 hours. Sutures do not dissolve and they need to be removed. Please follow-up with hand surgeon, Dr. Peggye Ley to rule out possible extensor tendon injury of left fifth digit. Please take Keflex 3 times daily for the next 7 days to prevent infection. Return to the emergency department with any redness or streaking surrounding laceration sites.

## 2019-02-07 NOTE — ED Triage Notes (Signed)
Pt to ED via POV c/o laceration to the left pinky finger. Pt states that he cut his finger with a chainsaw. Bleeding is controlled at this time.

## 2019-02-12 NOTE — Progress Notes (Signed)
       Patient: Albert Carpenter Male    DOB: May 08, 1961   58 y.o.   MRN: [id number] Visit Date: 02/15/2019  Today's Provider: Mar Daring, PA-C   No chief complaint on file.  Subjective:     HPI  Patient is here for suture removal. Patient has 9 stitches in left fifth digit. Patient has been taking antibiotic almost done. He cut his finger with a chainsaw on 02/07/19.    Allergies  Allergen Reactions  . Bee Venom Swelling    No current outpatient medications on file.  Review of Systems  Constitutional: Negative for appetite change, chills and fever.  Respiratory: Negative for chest tightness, shortness of breath and wheezing.   Cardiovascular: Negative for chest pain and palpitations.  Gastrointestinal: Negative for abdominal pain, nausea and vomiting.  Skin: Positive for wound.    Social History   Tobacco Use  . Smoking status: Never Smoker  . Smokeless tobacco: Never Used  Substance Use Topics  . Alcohol use: Yes    Comment: occasional      Objective:   BP 120/83 (BP Location: Left Arm, Patient Position: Sitting, Cuff Size: Large)   Pulse 81   Temp 97.8 F (36.6 C) (Other (Comment)) Comment (Src): Forehead  Resp 16   Wt 240 lb (108.9 kg)   BMI 33.47 kg/m  Vitals:   02/15/19 1337  BP: 120/83  Pulse: 81  Resp: 16  Temp: 97.8 F (36.6 C)  TempSrc: Other (Comment)  Weight: 240 lb (108.9 kg)     Physical Exam Vitals signs reviewed.  Constitutional:      General: He is not in acute distress.    Appearance: Normal appearance. He is well-developed. He is not diaphoretic.  HENT:     Head: Normocephalic and atraumatic.  Neck:     Musculoskeletal: Normal range of motion and neck supple.     Thyroid: No thyromegaly.     Vascular: No JVD.     Trachea: No tracheal deviation.  Cardiovascular:     Rate and Rhythm: Normal rate and regular rhythm.     Heart sounds: Normal heart sounds. No murmur. No friction rub. No gallop.   Pulmonary:   Effort: Pulmonary effort is normal. No respiratory distress.     Breath sounds: Normal breath sounds. No wheezing or rales.  Lymphadenopathy:     Cervical: No cervical adenopathy.  Skin:    Findings: Laceration (2 lacerations on left 5th finger with 9 sutures present; some redness and swelling) present.  Neurological:     Mental Status: He is alert.      No results found for any visits on 02/15/19.     Assessment & Plan    1. Laceration of left little finger without foreign body without damage to nail, subsequent encounter Sutures removed without issue. Steri-strips placed. Advised to wash with soap and water. Allow steristrips to stay in place until they fall off. Call if worsening.      Mar Daring, PA-C  [facility]

## 2019-02-15 ENCOUNTER — Other Ambulatory Visit: Payer: Self-pay

## 2019-02-15 ENCOUNTER — Ambulatory Visit: Payer: BC Managed Care – PPO | Admitting: Physician Assistant

## 2019-02-15 ENCOUNTER — Encounter: Payer: Self-pay | Admitting: Physician Assistant

## 2019-02-15 VITALS — BP 120/83 | HR 81 | Temp 97.8°F | Resp 16 | Wt 240.0 lb

## 2019-02-15 DIAGNOSIS — S61217D Laceration without foreign body of left little finger without damage to nail, subsequent encounter: Secondary | ICD-10-CM | POA: Diagnosis not present

## 2019-07-29 ENCOUNTER — Encounter: Payer: Self-pay | Admitting: Physician Assistant

## 2019-07-29 ENCOUNTER — Ambulatory Visit: Payer: BC Managed Care – PPO | Admitting: Physician Assistant

## 2019-07-29 ENCOUNTER — Other Ambulatory Visit: Payer: Self-pay

## 2019-07-29 ENCOUNTER — Ambulatory Visit: Payer: Self-pay | Admitting: *Deleted

## 2019-07-29 VITALS — BP 133/90 | HR 74 | Temp 98.2°F | Wt 242.2 lb

## 2019-07-29 DIAGNOSIS — Z125 Encounter for screening for malignant neoplasm of prostate: Secondary | ICD-10-CM

## 2019-07-29 DIAGNOSIS — R7303 Prediabetes: Secondary | ICD-10-CM | POA: Diagnosis not present

## 2019-07-29 DIAGNOSIS — H8110 Benign paroxysmal vertigo, unspecified ear: Secondary | ICD-10-CM | POA: Diagnosis not present

## 2019-07-29 NOTE — Telephone Encounter (Signed)
Pt called in c/o severe dizziness that started 4:30 PM yesterday.   "I was able to drive home".   By 6:00 PM I was so dizzy I could not stand up.  I had nausea with the dizziness.  I made it to the sofa and slept for 13 hours.  Every time I would raise my head I would feel very lightheaded.    I feel fine this morning.   He just got his first pair of new contact lenses on Tuesday.  He has been wearing them fine.   He did call his eye doctor and they referred him to contact his PCP.   They did not think the dizziness was related to the contacts.   He is going to leave the contacts out until Monday when he gets back into town in case they contributed to the dizziness.    Protocol was to be seen within 24 hours.   He is going out of town this weekend too so wanted to be sure everything is ok.  I made him an appt with Carles Collet, PA-C for today at 10:00.  He can be here by 9:45 to be registered.   His provider Fenton Malling, PA-C did not have any openings today.    COVID-19 screening questions done.  I sent my notes to the office.  Reason for Disposition . [1] MODERATE dizziness (e.g., interferes with normal activities) AND [2] has NOT been evaluated by physician for this  (Exception: dizziness caused by heat exposure, sudden standing, or poor fluid intake)  Answer Assessment - Initial Assessment Questions 1. DESCRIPTION: "Describe your dizziness."     4:30 PM yesterday I started feeling lightheaded.    By the time I got home the dizziness was worse.  6 :00 PM it was so bad I could not stand up.   I had nausea.  I had a hot flash then I got cold.  I went to the sofa and slept for 13 hours.   Every time I raise my head I was lightheaded.    I called my eye doctor and they didn't think the dizziness was related to the contacts.    BP 128/80 (I've reduced my calories and lost 5 lbs for 2 weeks).   2 days ago Tues I was fitted for contacts for the first time.   All day Wed.  I was fine and Thursday  fine.    I did not drink enough water yesterday like usual.  Today I feel great.   2. LIGHTHEADED: "Do you feel lightheaded?" (e.g., somewhat faint, woozy, weak upon standing)     Not this morning.   3. VERTIGO: "Do you feel like either you or the room is spinning or tilting?" (i.e. vertigo)     No   I've never had vertigo. 4. SEVERITY: "How bad is it?"  "Do you feel like you are going to faint?" "Can you stand and walk?"   - MILD - walking normally   - MODERATE - interferes with normal activities (e.g., work, school)    - SEVERE - unable to stand, requires support to walk, feels like passing out now.      Tuesday I got the contacts and wore them all day.   Yesterday I started having the dizziness. 5. ONSET:  "When did the dizziness begin?"     Yesterday 4:30 PM I had problems with the dizziness. 6. AGGRAVATING FACTORS: "Does anything make it worse?" (e.g., standing, change in head position)  It was with raising my head.   My pulse is 72 now. 7. HEART RATE: "Can you tell me your heart rate?" "How many beats in 15 seconds?"  (Note: not all patients can do this)       72 now on my Apple Watch. 8. CAUSE: "What do you think is causing the dizziness?"     Yesterday pulse was 50-104 over the course of all day.     I'm eating healthier.   9. RECURRENT SYMPTOM: "Have you had dizziness before?" If so, ask: "When was the last time?" "What happened that time?"     No 10. OTHER SYMPTOMS: "Do you have any other symptoms?" (e.g., fever, chest pain, vomiting, diarrhea, bleeding)       Nausea when I was dizzy.  None of the above. 11. PREGNANCY: "Is there any chance you are pregnant?" "When was your last menstrual period?"       N/A  Protocols used: DIZZINESS [facility]

## 2019-07-29 NOTE — Progress Notes (Signed)
Patient: Albert Carpenter Male    DOB: 1961/02/24   59 y.o.   MRN: [id number] Visit Date: 07/29/2019  Today's Provider: Trinna Post, PA-C   Chief Complaint  Patient presents with  . Dizziness   Subjective:     Dizziness This is a new problem. The current episode started yesterday. The problem has been gradually improving. Associated symptoms include nausea and a visual change. Pertinent negatives include no change in bowel habit, chest pain, congestion, coughing, fatigue, fever, headaches, vomiting or weakness. The symptoms are aggravated by bending, standing, twisting and walking. He has tried lying down, relaxation, sleep and rest for the symptoms.    Reports he was fitted for contacts on Tuesday for the first time. On Wednesday he felt very dizzy, light headed and he had to hold onto the stairs when he was coming down the stairs. He had to lie down at the bottom of the steps because he was so dizzy and nauseated. He stayed at the bottom of the stairs for a couple of hours until he was able to transfer to his sofa where he stayed over night and woke up this morning. Felt slightly dizzy this morning but largely improved. Reports he had a small headache yesterday. Denies chest pain, denies palpitations. Denies weakness. Unsure if the room was spinning or if he was just really dizzy. Never had this before. He has been on calorie deficit since January 1 to lose weight.   Wt Readings from Last 3 Encounters:  07/29/19 242 lb 3.2 oz (109.9 kg)  02/15/19 240 lb (108.9 kg)  02/07/19 240 lb (108.9 kg)     Allergies  Allergen Reactions  . Bee Venom Swelling    No current outpatient medications on file.  Review of Systems  Constitutional: Negative.  Negative for fatigue and fever.  HENT: Negative.  Negative for congestion.   Respiratory: Negative.  Negative for cough.   Cardiovascular: Negative.  Negative for chest pain.  Gastrointestinal: Positive for nausea. Negative for  change in bowel habit and vomiting.  Neurological: Positive for dizziness. Negative for weakness and headaches.    Social History   Tobacco Use  . Smoking status: Never Smoker  . Smokeless tobacco: Never Used  Substance Use Topics  . Alcohol use: Yes    Comment: occasional      Objective:   BP 133/90 (BP Location: Left Arm, Patient Position: Sitting, Cuff Size: Normal)   Pulse 74   Temp 98.2 F (36.8 C) (Temporal)   Wt 242 lb 3.2 oz (109.9 kg)   BMI 33.78 kg/m  Vitals:   07/29/19 0953  BP: 133/90  Pulse: 74  Temp: 98.2 F (36.8 C)  TempSrc: Temporal  Weight: 242 lb 3.2 oz (109.9 kg)  Body mass index is 33.78 kg/m.   Physical Exam Constitutional:      Appearance: Normal appearance.  HENT:     Head:     Comments: Patient feels symptoms when he looks upward.     Right Ear: Tympanic membrane and ear canal normal.     Left Ear: Tympanic membrane and ear canal normal.     Mouth/Throat:     Mouth: Mucous membranes are moist.     Pharynx: Oropharynx is clear.  Eyes:     Extraocular Movements: Extraocular movements intact.     Conjunctiva/sclera: Conjunctivae normal.     Pupils: Pupils are equal, round, and reactive to light.  Cardiovascular:     Rate and  Rhythm: Normal rate and regular rhythm.     Heart sounds: Normal heart sounds.  Pulmonary:     Effort: Pulmonary effort is normal.     Breath sounds: Normal breath sounds.  Skin:    General: Skin is warm.  Neurological:     General: No focal deficit present.     Mental Status: He is alert and oriented to person, place, and time.     Cranial Nerves: No cranial nerve deficit.     Sensory: No sensory deficit.     Motor: No weakness.     Coordination: Coordination normal.     Gait: Gait normal.  Psychiatric:        Mood and Affect: Mood normal.        Behavior: Behavior normal.      No results found for any visits on 07/29/19.     Assessment & Plan    1. Benign paroxysmal positional vertigo,  unspecified laterality  Symptoms consistent with BPPV. Advised patient on treatment with meclizine. Declines nausea medicine. Counseled on Epley Maneuvers. Counseled this can take a week or so to subside and also may be recurrent. If worsening despite conservative interventions would consider ENT referral.   2. Prostate cancer screening  - PSA  3. Prediabetes  - TSH - Lipid panel - Comprehensive metabolic panel - CBC with Differential/Platelet - HgB A1c      Trinna Post, PA-C  [facility]

## 2019-07-29 NOTE — Patient Instructions (Signed)

## 2019-07-30 LAB — COMPREHENSIVE METABOLIC PANEL
ALT: 28 IU/L (ref 0–44)
AST: 22 IU/L (ref 0–40)
Albumin/Globulin Ratio: 2.2 (ref 1.2–2.2)
Albumin: 4.4 g/dL (ref 3.8–4.9)
Alkaline Phosphatase: 70 IU/L (ref 39–117)
BUN/Creatinine Ratio: 18 (ref 9–20)
BUN: 18 mg/dL (ref 6–24)
Bilirubin Total: 0.5 mg/dL (ref 0.0–1.2)
CO2: 20 mmol/L (ref 20–29)
Calcium: 9.3 mg/dL (ref 8.7–10.2)
Chloride: 105 mmol/L (ref 96–106)
Creatinine, Ser: 1.02 mg/dL (ref 0.76–1.27)
GFR calc Af Amer: 93 mL/min/{1.73_m2} (ref >59)
GFR calc non Af Amer: 81 mL/min/{1.73_m2} (ref >59)
Globulin, Total: 2 g/dL (ref 1.5–4.5)
Glucose: 92 mg/dL (ref 65–99)
Potassium: 4.4 mmol/L (ref 3.5–5.2)
Sodium: 141 mmol/L (ref 134–144)
Total Protein: 6.4 g/dL (ref 6.0–8.5)

## 2019-07-30 LAB — HEMOGLOBIN A1C
Est. average glucose Bld gHb Est-mCnc: 114 mg/dL
Hgb A1c MFr Bld: 5.6 % (ref 4.8–5.6)

## 2019-07-30 LAB — LIPID PANEL
Chol/HDL Ratio: 3.8 ratio (ref 0.0–5.0)
Cholesterol, Total: 189 mg/dL (ref 100–199)
HDL: 50 mg/dL (ref >39)
LDL Chol Calc (NIH): 110 mg/dL — ABNORMAL HIGH (ref 0–99)
Triglycerides: 164 mg/dL — ABNORMAL HIGH (ref 0–149)
VLDL Cholesterol Cal: 29 mg/dL (ref 5–40)

## 2019-07-30 LAB — PSA: Prostate Specific Ag, Serum: 1.2 ng/mL (ref 0.0–4.0)

## 2019-07-30 LAB — CBC WITH DIFFERENTIAL/PLATELET
Basophils Absolute: 0.1 10*3/uL (ref 0.0–0.2)
Basos: 1 %
EOS (ABSOLUTE): 0.1 10*3/uL (ref 0.0–0.4)
Eos: 2 %
Hematocrit: 48.5 % (ref 37.5–51.0)
Hemoglobin: 17 g/dL (ref 13.0–17.7)
Immature Grans (Abs): 0 10*3/uL (ref 0.0–0.1)
Immature Granulocytes: 0 %
Lymphocytes Absolute: 1.7 10*3/uL (ref 0.7–3.1)
Lymphs: 23 %
MCH: 30.4 pg (ref 26.6–33.0)
MCHC: 35.1 g/dL (ref 31.5–35.7)
MCV: 87 fL (ref 79–97)
Monocytes Absolute: 0.4 10*3/uL (ref 0.1–0.9)
Monocytes: 5 %
Neutrophils Absolute: 5.1 10*3/uL (ref 1.4–7.0)
Neutrophils: 69 %
Platelets: 213 10*3/uL (ref 150–450)
RBC: 5.59 x10E6/uL (ref 4.14–5.80)
RDW: 13 % (ref 11.6–15.4)
WBC: 7.4 10*3/uL (ref 3.4–10.8)

## 2019-07-30 LAB — TSH: TSH: 2.4 u[IU]/mL (ref 0.450–4.500)

## 2019-09-21 NOTE — Progress Notes (Signed)
Patient: Albert Carpenter, Male    DOB: February 22, 1961, 59 y.o.   MRN: [id number] Visit Date: 09/23/2019  Today's Provider: Mar Daring, PA-C   Chief Complaint  Patient presents with  . Annual Exam   Subjective:     Annual physical exam Albert Carpenter is a 59 y.o. male who presents today for health maintenance and complete physical. He feels well. He reports exercising none. He reports he is sleeping well.  ----------------------------------------------------------------- BP Readings from Last 3 Encounters:  09/23/19 120/88  07/29/19 133/90  02/15/19 120/83   Wt Readings from Last 3 Encounters:  09/23/19 232 lb 12.8 oz (105.6 kg)  07/29/19 242 lb 3.2 oz (109.9 kg)  02/15/19 240 lb (108.9 kg)    Review of Systems  Constitutional: Negative.   HENT: Negative.   Eyes: Negative.   Respiratory: Negative.   Cardiovascular: Negative.   Gastrointestinal: Negative.   Endocrine: Negative.   Genitourinary: Negative.   Musculoskeletal: Negative.   Skin: Negative.   Allergic/Immunologic: Negative.   Neurological: Negative.   Hematological: Negative.   Psychiatric/Behavioral: Negative.     Social History      He  reports that he has never smoked. He has never used smokeless tobacco. He reports current alcohol use. He reports that he does not use drugs.       Social History   Socioeconomic History  . Marital status: Married    Spouse name: Not on file  . Number of children: Not on file  . Years of education: Not on file  . Highest education level: Not on file  Occupational History  . Not on file  Tobacco Use  . Smoking status: Never Smoker  . Smokeless tobacco: Never Used  Substance and Sexual Activity  . Alcohol use: Yes    Comment: occasional  . Drug use: No  . Sexual activity: Not on file  Other Topics Concern  . Not on file  Social History Narrative  . Not on file   Social Determinants of Health   Financial Resource Strain:   .  Difficulty of Paying Living Expenses:   Food Insecurity:   . Worried About Charity fundraiser in the Last Year:   . Arboriculturist in the Last Year:   Transportation Needs:   . Film/video editor (Medical):   Marland Kitchen Lack of Transportation (Non-Medical):   Physical Activity:   . Days of Exercise per Week:   . Minutes of Exercise per Session:   Stress:   . Feeling of Stress :   Social Connections:   . Frequency of Communication with Friends and Family:   . Frequency of Social Gatherings with Friends and Family:   . Attends Religious Services:   . Active Member of Clubs or Organizations:   . Attends Archivist Meetings:   Marland Kitchen Marital Status:     History reviewed. No pertinent past medical history.   Patient Active Problem List   Diagnosis Date Noted  . 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 Diverticulosis of colon 07/27/2015  . Abnormal liver enzymes 07/27/2015  . Back pain, thoracic 07/27/2015  . Adiposity 07/27/2015  . 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 Restless leg 07/27/2015  . Gallstone 10/12/2012    Past Surgical History:  Procedure Laterality Date  . CHOLECYSTECTOMY  2013  . COLONOSCOPY  01/08/11  . COLONOSCOPY WITH PROPOFOL N/A 12/12/2015   Procedure: COLONOSCOPY WITH PROPOFOL;  Surgeon: Robert Bellow, MD;  Location: [facility] ENDOSCOPY;  Service: Endoscopy;  Laterality: N/A;  . CYST REMOVAL LEG Left 2010  . KNEE SURGERY Bilateral 2000's  . SHOULDER SURGERY Right 2010  . SYNOVECTOMY     due to complex medical bilateral meniscus tear  . TONSILLECTOMY  kid    Family History        Family Status  Relation Name Status  . Mother  Alive  . Father  Deceased at age 63       MI  . Sister  Deceased at age 59       hepatitis  . Son  Alive  . Son  Alive  . Daughter  Alive        His family history includes Alzheimer's disease in his mother; Healthy in his daughter, son, and son; Heart attack in his father; Hepatitis in his sister; Obesity in his father.        Allergies  Allergen Reactions  . Bee Venom Swelling    No current outpatient medications on file.   Patient Care Team: Mar Daring, PA-C as PCP - General (Family Medicine) Christene Lye, MD (General Surgery)    Objective:    Vitals: BP 120/88 (BP Location: Left Arm, Patient Position: Sitting, Cuff Size: Large)   Pulse 65   Temp (!) 97.1 F (36.2 C) (Temporal)   Resp 16   Ht 5\' 11"  (1.803 m)   Wt 232 lb 12.8 oz (105.6 kg)   BMI 32.47 kg/m    Vitals:   09/23/19 1339  BP: 120/88  Pulse: 65  Resp: 16  Temp: (!) 97.1 F (36.2 C)  TempSrc: Temporal  Weight: 232 lb 12.8 oz (105.6 kg)  Height: 5\' 11"  (1.803 m)     Physical Exam Constitutional:      General: He is not in acute distress.    Appearance: Normal appearance. He is well-developed. He is obese. He is not ill-appearing.  HENT:     Head: Normocephalic and atraumatic.     Right Ear: Tympanic membrane, ear canal and external ear normal.     Left Ear: Tympanic membrane, ear canal and external ear normal.  Eyes:     General: No scleral icterus.       Right eye: No discharge.        Left eye: No discharge.     Extraocular Movements: Extraocular movements intact.     Conjunctiva/sclera: Conjunctivae normal.     Pupils: Pupils are equal, round, and reactive to light.  Neck:     Thyroid: No thyromegaly.     Vascular: No carotid bruit.     Trachea: No tracheal deviation.  Cardiovascular:     Rate and Rhythm: Normal rate and regular rhythm.     Pulses: Normal pulses.     Heart sounds: Normal heart sounds. No murmur.  Pulmonary:     Effort: Pulmonary effort is normal. No respiratory distress.     Breath sounds: Normal breath sounds. No wheezing or rales.  Chest:     Chest wall: No tenderness.  Abdominal:     General: Abdomen is flat. Bowel sounds are normal. There is no distension.     Palpations: Abdomen is soft. There is no mass.     Tenderness: There is no abdominal tenderness. There is  no guarding or rebound.  Musculoskeletal:        General: No tenderness. Normal range of motion.     Cervical back: Normal range of motion and neck supple.  Right lower leg: No edema.     Left lower leg: No edema.  Lymphadenopathy:     Cervical: No cervical adenopathy.  Skin:    General: Skin is warm and dry.     Capillary Refill: Capillary refill takes less than 2 seconds.     Findings: No erythema or rash.  Neurological:     General: No focal deficit present.     Mental Status: He is alert and oriented to person, place, and time. Mental status is at baseline.     Cranial Nerves: No cranial nerve deficit.     Motor: No abnormal muscle tone.     Coordination: Coordination normal.     Deep Tendon Reflexes: Reflexes are normal and symmetric. Reflexes normal.  Psychiatric:        Mood and Affect: Mood normal.        Behavior: Behavior normal.        Thought Content: Thought content normal.        Judgment: Judgment normal.      Depression Screen PHQ 2/9 Scores 09/23/2019 07/22/2018 07/04/2017 07/31/2015  PHQ - 2 Score 0 0 0 0  PHQ- 9 Score - 0 - -       Assessment & Plan:     Routine Health Maintenance and Physical Exam  Exercise Activities and Dietary recommendations Goals   None     Immunization History  Administered Date(s) Administered  . Influenza Inj Mdck Quad Pf 04/24/2018  . Influenza,inj,Quad PF,6+ Mos 07/04/2017  . Tdap 05/20/2011, 11/12/2016  . Zoster Recombinat (Shingrix) 07/22/2018, 12/02/2018    Health Maintenance  Topic Date Due  . INFLUENZA VACCINE  01/30/2019  . COLONOSCOPY  12/11/2020  . TETANUS/TDAP  11/13/2026  . Hepatitis C Screening  Completed  . HIV Screening  Completed     Discussed health benefits of physical activity, and encouraged him to engage in regular exercise appropriate for his age and condition.    1. Annual physical exam Normal exam. Labs reviewed and normal. Return for CPE in one year.   2. Prostate cancer  screening Lab normal.   3. Class 1 obesity due to excess calories with serious comorbidity and body mass index (BMI) of 32.0 to 32.9 in adult Counseled patient on healthy lifestyle modifications including dieting and exercise.   4. Abnormal liver enzymes H/O this but improved on most recent labs.   5. Moderate mixed hyperlipidemia not requiring statin therapy Diet controlled.   --------------------------------------------------------------------    Mar Daring, PA-C  [facility]

## 2019-09-23 ENCOUNTER — Ambulatory Visit (INDEPENDENT_AMBULATORY_CARE_PROVIDER_SITE_OTHER): Payer: BC Managed Care – PPO | Admitting: Physician Assistant

## 2019-09-23 ENCOUNTER — Encounter: Payer: Self-pay | Admitting: Physician Assistant

## 2019-09-23 ENCOUNTER — Other Ambulatory Visit: Payer: Self-pay

## 2019-09-23 VITALS — BP 120/88 | HR 65 | Temp 97.1°F | Resp 16 | Ht 71.0 in | Wt 232.8 lb

## 2019-09-23 DIAGNOSIS — Z125 Encounter for screening for malignant neoplasm of prostate: Secondary | ICD-10-CM

## 2019-09-23 DIAGNOSIS — R748 Abnormal levels of other serum enzymes: Secondary | ICD-10-CM

## 2019-09-23 DIAGNOSIS — E782 Mixed hyperlipidemia: Secondary | ICD-10-CM

## 2019-09-23 DIAGNOSIS — Z6832 Body mass index (BMI) 32.0-32.9, adult: Secondary | ICD-10-CM

## 2019-09-23 DIAGNOSIS — E6609 Other obesity due to excess calories: Secondary | ICD-10-CM

## 2019-09-23 DIAGNOSIS — Z0000 Encounter for general adult medical examination without abnormal findings: Secondary | ICD-10-CM | POA: Diagnosis not present

## 2019-09-23 NOTE — Patient Instructions (Signed)

## 2019-09-27 ENCOUNTER — Encounter: Payer: Self-pay | Admitting: Physician Assistant

## 2020-01-18 ENCOUNTER — Ambulatory Visit: Payer: BC Managed Care – PPO | Admitting: Dermatology

## 2020-01-18 ENCOUNTER — Other Ambulatory Visit: Payer: Self-pay

## 2020-01-18 DIAGNOSIS — L821 Other seborrheic keratosis: Secondary | ICD-10-CM

## 2020-01-18 DIAGNOSIS — D2239 Melanocytic nevi of other parts of face: Secondary | ICD-10-CM | POA: Diagnosis not present

## 2020-01-18 DIAGNOSIS — D485 Neoplasm of uncertain behavior of skin: Secondary | ICD-10-CM

## 2020-01-18 DIAGNOSIS — D492 Neoplasm of unspecified behavior of bone, soft tissue, and skin: Secondary | ICD-10-CM

## 2020-01-18 NOTE — Progress Notes (Signed)
   Follow-Up Visit   Subjective  Albert Carpenter is a 59 y.o. male who presents for the following: Skin Problem (check a area on my nose appeared about 10 years ago, area started to bleed 3 days ago  ).  The following portions of the chart were reviewed this encounter and updated as appropriate:  Tobacco  Allergies  Meds  Problems  Med Hx  Surg Hx  Fam Hx     Review of Systems:  No other skin or systemic complaints except as noted in HPI or Assessment and Plan.  Objective  Well appearing patient in no apparent distress; mood and affect are within normal limits.  A focused examination was performed including face, hands . Relevant physical exam findings are noted in the Assessment and Plan.  Objective  Right Hand - Anterior: Stuck-on, waxy, tan-brown papules and plaques -- Discussed benign etiology and prognosis.   Objective  L nasal tip: Flesh papule    Assessment & Plan    Neoplasm of skin mid dorsum nose  Epidermal / dermal shaving  Lesion diameter (cm):  0.4 Informed consent: discussed and consent obtained   Timeout: patient name, date of birth, surgical site, and procedure verified   Procedure prep:  Patient was prepped and draped in usual sterile fashion Prep type:  Isopropyl alcohol Anesthesia: the lesion was anesthetized in a standard fashion   Anesthetic:  1% lidocaine w/ epinephrine 1-100,000 buffered w/ 8.4% NaHCO3 Hemostasis achieved with: pressure, aluminum chloride and electrodesiccation   Outcome: patient tolerated procedure well   Post-procedure details: sterile dressing applied and wound care instructions given   Dressing type: bandage and petrolatum   Additional details:  0.7 cm  Specimen 1 - Surgical pathology Differential Diagnosis: R/O Fibrous papule vs BCC Check Margins: No  Seborrheic keratosis Right Hand - Anterior Benign-appearing.  Observation.  Call clinic for new or changing moles.  Recommend daily use of broad spectrum spf 30+  sunscreen to sun-exposed areas.   Fibrous papule of nose L nasal tip Benign-appearing.  Observation.  Call clinic for new or changing moles.  Recommend daily use of broad spectrum spf 30+ sunscreen to sun-exposed areas.   Return if symptoms worsen or fail to improve.  IMarye Round, CMA, am acting as scribe for Sarina Ser, MD .  Documentation: I have reviewed the above documentation for accuracy and completeness, and I agree with the above.  Sarina Ser, MD

## 2020-01-18 NOTE — Patient Instructions (Signed)

## 2020-01-20 ENCOUNTER — Telehealth: Payer: Self-pay

## 2020-01-20 ENCOUNTER — Encounter: Payer: Self-pay | Admitting: Dermatology

## 2020-01-20 NOTE — Telephone Encounter (Signed)
LM on VM to return my call. 

## 2020-01-20 NOTE — Telephone Encounter (Signed)
-----   Message from Ralene Bathe, MD sent at 01/20/2020  5:38 PM EDT ----- Skin , mid dorsum nose SURFACE OF FIBROUS PAPULE, EXCORIATED  Benign fibrous papule May recur No further treatment at this time recommended

## 2020-02-02 ENCOUNTER — Telehealth: Payer: Self-pay

## 2020-02-02 NOTE — Telephone Encounter (Signed)
Left message on voicemail to return my call.  

## 2020-02-02 NOTE — Telephone Encounter (Signed)
-----   Message from Ralene Bathe, MD sent at 01/20/2020  5:38 PM EDT ----- Skin , mid dorsum nose SURFACE OF FIBROUS PAPULE, EXCORIATED  Benign fibrous papule May recur No further treatment at this time recommended

## 2020-02-03 NOTE — Telephone Encounter (Signed)
Patient advised of biopsy results.

## 2020-02-08 ENCOUNTER — Encounter: Payer: Self-pay | Admitting: Family Medicine

## 2020-02-08 ENCOUNTER — Ambulatory Visit: Payer: BC Managed Care – PPO | Admitting: Family Medicine

## 2020-02-08 ENCOUNTER — Ambulatory Visit: Payer: Self-pay | Admitting: *Deleted

## 2020-02-08 ENCOUNTER — Other Ambulatory Visit: Payer: Self-pay

## 2020-02-08 VITALS — BP 112/82 | HR 82 | Temp 98.4°F | Wt 241.0 lb

## 2020-02-08 DIAGNOSIS — R21 Rash and other nonspecific skin eruption: Secondary | ICD-10-CM

## 2020-02-08 NOTE — Progress Notes (Signed)
Trena Platt Cummings,acting as a scribe for Wilhemena Durie, MD.,have documented all relevant documentation on the behalf of Wilhemena Durie, MD,as directed by  Wilhemena Durie, MD while in the presence of Wilhemena Durie, MD.  Established patient visit   Patient: Albert Carpenter   DOB: [birth year]   59 y.o. Male  MRN: [id number] Visit Date: 02/08/2020  Today's healthcare provider: Wilhemena Durie, MD   Chief Complaint  Patient presents with  . Rash   Subjective    Rash This is a new problem. The current episode started today. The affected locations include the left shoulder, chest, torso and abdomen. The rash is characterized by redness. Pertinent negatives include no congestion, cough, diarrhea, eye pain, fatigue, fever, joint pain, rhinorrhea, shortness of breath, sore throat or vomiting. Past treatments include nothing.    Patient recently got covid vaccine so he believes that is where his rash on his left shoulder came from.   He has been outside some recently and notices some spots on his trunk and arms.      Medications: No outpatient medications prior to visit.   No facility-administered medications prior to visit.    Review of Systems  Constitutional: Negative for fatigue and fever.  HENT: Negative for congestion, rhinorrhea and sore throat.   Eyes: Negative for pain.  Respiratory: Negative for cough and shortness of breath.   Gastrointestinal: Negative for diarrhea and vomiting.  Musculoskeletal: Negative for joint pain.  Skin: Positive for rash.       Objective    There were no vitals taken for this visit.    Physical Exam Vitals reviewed.  Constitutional:      Appearance: Normal appearance.  HENT:     Head: Normocephalic and atraumatic.     Right Ear: External ear normal.     Left Ear: External ear normal.  Eyes:     General: No scleral icterus.    Conjunctiva/sclera: Conjunctivae normal.  Cardiovascular:     Rate and Rhythm:  Normal rate and regular rhythm.     Pulses: Normal pulses.     Heart sounds: Normal heart sounds.  Pulmonary:     Effort: Pulmonary effort is normal.     Breath sounds: Normal breath sounds.  Musculoskeletal:     Right lower leg: No edema.     Left lower leg: No edema.  Skin:    General: Skin is warm and dry.     Findings: Lesion and rash present.     Comments: There is an erythematous rash without tenderness in the area of his left deltoid where he had his Covid vaccine. There are several maculopapular spots on his trunk that appear to be bug bites.  No sign of secondary infection.  These are random and no obvious rash noted.  Neurological:     General: No focal deficit present.     Mental Status: He is alert and oriented to person, place, and time.  Psychiatric:        Mood and Affect: Mood normal.        Behavior: Behavior normal.        Thought Content: Thought content normal.        Judgment: Judgment normal.       No results found for any visits on 02/08/20.  Assessment & Plan    1. Rash and nonspecific skin eruption -The rash on his deltoid is secondary to Covid vaccine.  Use ice and heat and allow  this to heal/resolve with time. I think the bumps on his trunk are secondary to bug bites.  No specific etiology identified but these are none automatic at this time.  He is agreement just to follow these and treat with topical Caladryl/hydrocortisone.  No follow-ups on file.         Shayla Heming Cranford Mon, MD  [facility] [phone] (phone) [phone] (fax)  [place]

## 2020-02-08 NOTE — Telephone Encounter (Signed)
Summary: covid vaccine reaction    Pt received his 1st covid vaccine(moderna) on 8.3.21 and the spot is still red/ about a 4inch area with little red dots on his body from his neck to his torso/ looks like mosquito bites / Pt wants to know if this is a normal side effect      Left message to call back. ( May be symptoms of COVID arm)

## 2020-02-08 NOTE — Telephone Encounter (Signed)
  Reason for Disposition . [1] Redness or red streak around the injection site AND [2] started > 48 hours after getting vaccine AND [3] no fever  (Exception: red area < 1 inch or 2.5 cm wide)  Answer Assessment - Initial Assessment Questions 1. MAIN CONCERN OR SYMPTOM:  "What is your main concern right now?" "What question do you have?" "What's the main symptom you're worried about?" (e.g., fever, pain, redness, swelling)     Red area at vaccine site, rash- neck/torso- rash on neck/torso different from rash on arm. 2. VACCINE: "What vaccination did you receive?" "Is this your first or second shot?" (e.g., none; AstraZeneca, J&J, [place], Morrison, other)     8/3-Moderna 3. SYMPTOM ONSET: "When did the rash begin?" (e.g., not relevant; hours, days)      today 4. SYMPTOM SEVERITY: "How bad is it?"      About 10-14 5. FEVER: "Is there a fever?" If so, ask: "What is it, how was it measured, and when did it start?"      No fever 6. PAST REACTIONS: "Have you reacted to immunizations before?" If so, ask: "What happened?"     No never 7. OTHER SYMPTOMS: "Do you have any other symptoms?"     Not itching- barely raised  Protocols used: CORONAVIRUS (COVID-19) VACCINE QUESTIONS AND REACTIONS-A-AH

## 2020-04-10 ENCOUNTER — Telehealth: Payer: Self-pay

## 2020-04-10 NOTE — Telephone Encounter (Signed)
Copied from [place] [id number]. Topic: General - Other >> Apr 10, 2020 11:30 AM Keene Breath wrote: Reason for CRM: Patient is requesting a copy of his blood work from January 2021.  He stated he is unable to print it from My Chart.  Please call patient to discuss at [phone]

## 2020-04-10 NOTE — Telephone Encounter (Signed)
Labs printed and placed up front ready for pick up

## 2020-08-17 ENCOUNTER — Telehealth: Payer: Self-pay

## 2020-08-17 NOTE — Telephone Encounter (Signed)
Copied from [place] [id number]. Topic: Appointment Scheduling - Scheduling Inquiry for Clinic >> Aug 17, 2020 12:00 PM Pawlus, Brayton Layman A wrote: Reason for CRM: PT called in requesting more information regarding a colonoscopy, PT would like to get this all set up in advance since he thinks he is due for one soon. Please advise.

## 2020-08-18 NOTE — Telephone Encounter (Signed)
Called patient and LVM advising him that colonoscopy will be ordered at his appointment on 09/26/2020.

## 2020-09-12 ENCOUNTER — Telehealth: Payer: Self-pay

## 2020-09-12 NOTE — Telephone Encounter (Signed)
LMTCB need to reschedule appt from 03/29 to another provider.

## 2020-09-26 ENCOUNTER — Encounter: Payer: BC Managed Care – PPO | Admitting: Physician Assistant

## 2020-09-28 ENCOUNTER — Encounter: Payer: Self-pay | Admitting: Physician Assistant

## 2020-09-28 ENCOUNTER — Ambulatory Visit (INDEPENDENT_AMBULATORY_CARE_PROVIDER_SITE_OTHER): Payer: BC Managed Care – PPO | Admitting: Physician Assistant

## 2020-09-28 VITALS — BP 121/88 | HR 63 | Temp 98.6°F | Ht 71.0 in | Wt 225.0 lb

## 2020-09-28 DIAGNOSIS — Z1211 Encounter for screening for malignant neoplasm of colon: Secondary | ICD-10-CM | POA: Diagnosis not present

## 2020-09-28 DIAGNOSIS — W57XXXD Bitten or stung by nonvenomous insect and other nonvenomous arthropods, subsequent encounter: Secondary | ICD-10-CM

## 2020-09-28 DIAGNOSIS — Z125 Encounter for screening for malignant neoplasm of prostate: Secondary | ICD-10-CM

## 2020-09-28 DIAGNOSIS — Z8249 Family history of ischemic heart disease and other diseases of the circulatory system: Secondary | ICD-10-CM

## 2020-09-28 DIAGNOSIS — J40 Bronchitis, not specified as acute or chronic: Secondary | ICD-10-CM

## 2020-09-28 DIAGNOSIS — R9431 Abnormal electrocardiogram [ECG] [EKG]: Secondary | ICD-10-CM

## 2020-09-28 DIAGNOSIS — R748 Abnormal levels of other serum enzymes: Secondary | ICD-10-CM | POA: Diagnosis not present

## 2020-09-28 DIAGNOSIS — Z0000 Encounter for general adult medical examination without abnormal findings: Secondary | ICD-10-CM | POA: Diagnosis not present

## 2020-09-28 DIAGNOSIS — Z6831 Body mass index (BMI) 31.0-31.9, adult: Secondary | ICD-10-CM

## 2020-09-28 DIAGNOSIS — E782 Mixed hyperlipidemia: Secondary | ICD-10-CM

## 2020-09-28 DIAGNOSIS — E6609 Other obesity due to excess calories: Secondary | ICD-10-CM

## 2020-09-28 MED ORDER — PREDNISONE 20 MG PO TABS: 20.0000 mg | ORAL_TABLET | Freq: Every day | ORAL | 0 refills | Status: DC

## 2020-09-28 NOTE — Patient Instructions (Signed)

## 2020-09-28 NOTE — Progress Notes (Signed)
Complete physical exam   Patient: Albert Carpenter   DOB: [birth year]   60 y.o. Male  MRN: [id number] Visit Date: 09/28/2020  Today's healthcare provider: Mar Daring, PA-C   Chief Complaint  Patient presents with  . Annual Exam   Subjective    Albert Carpenter is a 59 y.o. male who presents today for a complete physical exam.  He reports consuming a general diet. The patient does not participate in regular exercise at present. He generally feels well. He reports sleeping well. He does not have additional problems to discuss today.   Last CPE: 09/23/2019  HPI    No past medical history on file. Past Surgical History:  Procedure Laterality Date  . CHOLECYSTECTOMY  2013  . COLONOSCOPY  01/08/11  . COLONOSCOPY WITH PROPOFOL N/A 12/12/2015   Procedure: COLONOSCOPY WITH PROPOFOL;  Surgeon: Robert Bellow, MD;  Location: [facility] ENDOSCOPY;  Service: Endoscopy;  Laterality: N/A;  . CYST REMOVAL LEG Left 2010  . KNEE SURGERY Bilateral 2000's  . SHOULDER SURGERY Right 2010  . SYNOVECTOMY     due to complex medical bilateral meniscus tear  . TONSILLECTOMY  kid   Social History   Socioeconomic History  . Marital status: Married    Spouse name: Not on file  . Number of children: Not on file  . Years of education: Not on file  . Highest education level: Not on file  Occupational History  . Not on file  Tobacco Use  . Smoking status: Never Smoker  . Smokeless tobacco: Never Used  Vaping Use  . Vaping Use: Never used  Substance and Sexual Activity  . Alcohol use: Yes    Comment: occasional  . Drug use: No  . Sexual activity: Not on file  Other Topics Concern  . Not on file  Social History Narrative  . Not on file   Social Determinants of Health   Financial Resource Strain: Not on file  Food Insecurity: Not on file  Transportation Needs: Not on file  Physical Activity: Not on file  Stress: Not on file  Social Connections: Not on file  Intimate  Partner Violence: Not on file   Family Status  Relation Name Status  . Mother  Alive  . Father  Deceased at age 73       MI  . Sister  Deceased at age 67       hepatitis  . Son  Alive  . Son  Alive  . Daughter  Alive  . Neg Hx  (Not Specified)   Family History  Problem Relation Age of Onset  . Alzheimer's disease Mother   . Heart attack Father   . Obesity Father   . Hepatitis Sister   . Healthy Son   . Healthy Son   . Healthy Daughter   . Colon cancer Neg Hx   . Prostate cancer Neg Hx    Allergies  Allergen Reactions  . Bee Venom Swelling    Patient Care Team: Mar Daring, PA-C as PCP - General (Family Medicine) Christene Lye, MD (General Surgery)   Medications: No outpatient medications prior to visit.   No facility-administered medications prior to visit.    Review of Systems  Constitutional: Negative.  Negative for appetite change, chills, fatigue and fever.  HENT: Positive for congestion. Negative for dental problem, drooling, ear discharge, ear pain, facial swelling, hearing loss, mouth sores, nosebleeds, postnasal drip, rhinorrhea, sinus pressure, sinus pain, sneezing, sore  throat, tinnitus and trouble swallowing.   Eyes: Negative.  Negative for pain and visual disturbance.  Respiratory: Negative.  Negative for cough, chest tightness and shortness of breath.   Cardiovascular: Negative.  Negative for chest pain, palpitations and leg swelling.  Gastrointestinal: Negative.  Negative for abdominal pain, blood in stool, constipation, diarrhea, nausea and vomiting.  Endocrine: Negative.  Negative for polydipsia, polyphagia and polyuria.  Genitourinary: Negative.  Negative for dysuria and flank pain.  Musculoskeletal: Negative.  Negative for arthralgias, back pain, joint swelling, myalgias and neck stiffness.  Skin: Negative.  Negative for color change, rash and wound.  Allergic/Immunologic: Negative.   Neurological: Negative.  Negative for  dizziness, tremors, seizures, speech difficulty, weakness, light-headedness and headaches.  Hematological: Negative.   Psychiatric/Behavioral: Negative.  Negative for behavioral problems, confusion, decreased concentration, dysphoric mood and sleep disturbance. The patient is not nervous/anxious.     Last CBC Lab Results  Component Value Date   WBC 9.6 09/28/2020   HGB 16.7 09/28/2020   HCT 48.2 09/28/2020   MCV 89 09/28/2020   MCH 30.8 09/28/2020   RDW 13.1 09/28/2020   PLT 201 53/29/9242   Last metabolic panel Lab Results  Component Value Date   GLUCOSE 91 09/28/2020   NA 141 09/28/2020   K 4.9 09/28/2020   CL 103 09/28/2020   CO2 22 09/28/2020   BUN 15 09/28/2020   CREATININE 0.95 09/28/2020   GFRNONAA 81 07/29/2019   GFRAA 93 07/29/2019   CALCIUM 9.4 09/28/2020   PROT 6.4 09/28/2020   ALBUMIN 4.3 09/28/2020   LABGLOB 2.1 09/28/2020   AGRATIO 2.0 09/28/2020   BILITOT 0.6 09/28/2020   ALKPHOS 66 09/28/2020   AST 22 09/28/2020   ALT 27 09/28/2020      Objective    BP 121/88 (BP Location: Left Arm, Patient Position: Sitting, Cuff Size: Large)   Pulse 63   Temp 98.6 F (37 C) (Oral)   Ht 5\' 11"  (1.803 m)   Wt 225 lb (102.1 kg)   BMI 31.38 kg/m  BP Readings from Last 3 Encounters:  09/28/20 121/88  02/08/20 112/82  09/23/19 120/88   Wt Readings from Last 3 Encounters:  09/28/20 225 lb (102.1 kg)  02/08/20 241 lb (109.3 kg)  09/23/19 232 lb 12.8 oz (105.6 kg)      Physical Exam Vitals reviewed.  Constitutional:      General: He is not in acute distress.    Appearance: Normal appearance. He is well-developed. He is not ill-appearing.  HENT:     Head: Normocephalic and atraumatic.     Right Ear: Tympanic membrane, ear canal and external ear normal.     Left Ear: Tympanic membrane, ear canal and external ear normal.     Nose: Nose normal.     Mouth/Throat:     Mouth: Mucous membranes are moist.     Pharynx: Oropharynx is clear.  Eyes:     General:  No scleral icterus.       Right eye: No discharge.        Left eye: No discharge.     Extraocular Movements: Extraocular movements intact.     Conjunctiva/sclera: Conjunctivae normal.     Pupils: Pupils are equal, round, and reactive to light.  Neck:     Thyroid: No thyromegaly.     Vascular: No carotid bruit.     Trachea: No tracheal deviation.  Cardiovascular:     Rate and Rhythm: Normal rate and regular rhythm.  Pulses: Normal pulses.     Heart sounds: Normal heart sounds. No murmur heard.   Pulmonary:     Effort: Pulmonary effort is normal. No respiratory distress.     Breath sounds: Normal breath sounds. No wheezing or rales.  Chest:     Chest wall: No tenderness.  Abdominal:     General: Abdomen is flat. Bowel sounds are normal. There is no distension.     Palpations: Abdomen is soft. There is no mass.     Tenderness: There is no abdominal tenderness. There is no guarding or rebound.  Musculoskeletal:        General: No tenderness. Normal range of motion.     Cervical back: Normal range of motion and neck supple. No tenderness.     Right lower leg: No edema.     Left lower leg: No edema.  Lymphadenopathy:     Cervical: No cervical adenopathy.  Skin:    General: Skin is warm and dry.     Capillary Refill: Capillary refill takes less than 2 seconds.     Findings: No erythema or rash.  Neurological:     General: No focal deficit present.     Mental Status: He is alert and oriented to person, place, and time. Mental status is at baseline.     Cranial Nerves: No cranial nerve deficit.     Motor: No abnormal muscle tone.     Coordination: Coordination normal.     Deep Tendon Reflexes: Reflexes are normal and symmetric. Reflexes normal.  Psychiatric:        Mood and Affect: Mood normal.        Behavior: Behavior normal.        Thought Content: Thought content normal.        Judgment: Judgment normal.      Last depression screening scores PHQ 2/9 Scores 09/23/2019  07/22/2018 07/04/2017  PHQ - 2 Score 0 0 0  PHQ- 9 Score - 0 -   Last fall risk screening Fall Risk  07/31/2015  Falls in the past year? No   Last Audit-C alcohol use screening Alcohol Use Disorder Test (AUDIT) 09/23/2019  1. How often do you have a drink containing alcohol? 3  2. How many drinks containing alcohol do you have on a typical day when you are drinking? 0  3. How often do you have six or more drinks on one occasion? 2  AUDIT-C Score 5  Alcohol Brief Interventions/Follow-up AUDIT Score <7 follow-up not indicated   A score of 3 or more in women, and 4 or more in men indicates increased risk for alcohol abuse, EXCEPT if all of the points are from question 1   No results found for any visits on 09/28/20.  Assessment & Plan    Routine Health Maintenance and Physical Exam  Exercise Activities and Dietary recommendations Goals   None     Immunization History  Administered Date(s) Administered  . Influenza Inj Mdck Quad Pf 04/24/2018  . Influenza,inj,Quad PF,6+ Mos 07/04/2017  . Tdap 05/20/2011, 11/12/2016  . Zoster Recombinat (Shingrix) 07/22/2018, 12/02/2018    Health Maintenance  Topic Date Due  . COVID-19 Vaccine (1) Never done  . INFLUENZA VACCINE  11/14/2020 (Originally 01/30/2020)  . COLONOSCOPY (Pts 45-57yrs Insurance coverage will need to be confirmed)  12/11/2020  . TETANUS/TDAP  11/13/2026  . Hepatitis C Screening  Completed  . HIV Screening  Completed  . HPV VACCINES  Aged Out    Discussed health benefits  of physical activity, and encouraged him to engage in regular exercise appropriate for his age and condition.  1. Annual physical exam Normal physical exam today. Will check labs as below and f/u pending lab results. If labs are stable and WNL he will not need to have these rechecked for one year at his next annual physical exam. He is to call the office in the meantime if he has any acute issue, questions or concerns. - CBC w/Diff/Platelet -  Comprehensive Metabolic Panel (CMET) - TSH - Lipid Panel With LDL/HDL Ratio - HgB A1c - PSA  2. Colon cancer screening Due for colonoscopy. Referral placed.  - Ambulatory referral to Gastroenterology  3. Prostate cancer screening Will check labs as below and f/u pending results. - PSA  4. Abnormal liver enzymes Will check labs as below and f/u pending results. - Comprehensive Metabolic Panel (CMET) - Lipid Panel With LDL/HDL Ratio  5. Moderate mixed hyperlipidemia not requiring statin therapy Will check labs as below and f/u pending results. - Lipid Panel With LDL/HDL Ratio  6. Tick bite, unspecified site, subsequent encounter H/O this. Will check labs as below and f/u pending results. - [place] mtn spotted fvr abs pnl(IgG+IgM)  7. Bronchitis New issue, improving but not quite resolved. Prednisone sent in as below.  - predniSONE (DELTASONE) 20 MG tablet; Take 1 tablet (20 mg total) by mouth daily with breakfast.  Dispense: 5 tablet; Refill: 0  8. 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 No known previous incident. Does have strong family history of CAD. Will refer to cardiology as below for further evaluation.  - EKG 12-Lead - Ambulatory referral to Cardiology  9. Class 1 obesity due to excess calories with serious comorbidity and body mass index (BMI) of 31.0 to 31.9 in adult Counseled patient on healthy lifestyle modifications including dieting and exercise.  - Ambulatory referral to Cardiology  10. Abnormal EKG See above medical treatment plan. - Ambulatory referral to Cardiology   No follow-ups on file.     Reynolds Bowl, PA-C, have reviewed all documentation for this visit. The documentation on 10/08/20 for the exam, diagnosis, procedures, and orders are all accurate and complete.   Rubye Beach  [facility] [phone] (phone) [phone] (fax)  [place]

## 2020-09-29 ENCOUNTER — Telehealth: Payer: Self-pay | Admitting: Physician Assistant

## 2020-09-29 NOTE — Telephone Encounter (Signed)
Patient is calling the clarify the results of his EKG. Please advise CB- [phone]

## 2020-10-02 NOTE — Telephone Encounter (Signed)
I am not sure what he wants to know. We discussed in the office. He has been referred to Cardiology for further evaluation

## 2020-10-02 NOTE — Telephone Encounter (Signed)
Please advise 

## 2020-10-02 NOTE — Telephone Encounter (Signed)
Pt wants to speak with Anderson Malta.  He said he would like to clarify something she said to him. cb  [phone]

## 2020-10-03 ENCOUNTER — Encounter: Payer: Self-pay | Admitting: Physician Assistant

## 2020-10-04 ENCOUNTER — Encounter: Payer: Self-pay | Admitting: Physician Assistant

## 2020-10-04 LAB — CBC WITH DIFFERENTIAL/PLATELET
Basophils Absolute: 0.1 10*3/uL (ref 0.0–0.2)
Basos: 1 %
EOS (ABSOLUTE): 0.2 10*3/uL (ref 0.0–0.4)
Eos: 2 %
Hematocrit: 48.2 % (ref 37.5–51.0)
Hemoglobin: 16.7 g/dL (ref 13.0–17.7)
Immature Grans (Abs): 0.1 10*3/uL (ref 0.0–0.1)
Immature Granulocytes: 1 %
Lymphocytes Absolute: 2 10*3/uL (ref 0.7–3.1)
Lymphs: 21 %
MCH: 30.8 pg (ref 26.6–33.0)
MCHC: 34.6 g/dL (ref 31.5–35.7)
MCV: 89 fL (ref 79–97)
Monocytes Absolute: 0.5 10*3/uL (ref 0.1–0.9)
Monocytes: 5 %
Neutrophils Absolute: 6.8 10*3/uL (ref 1.4–7.0)
Neutrophils: 70 %
Platelets: 201 10*3/uL (ref 150–450)
RBC: 5.42 x10E6/uL (ref 4.14–5.80)
RDW: 13.1 % (ref 11.6–15.4)
WBC: 9.6 10*3/uL (ref 3.4–10.8)

## 2020-10-04 LAB — COMPREHENSIVE METABOLIC PANEL
ALT: 27 IU/L (ref 0–44)
AST: 22 IU/L (ref 0–40)
Albumin/Globulin Ratio: 2 (ref 1.2–2.2)
Albumin: 4.3 g/dL (ref 3.8–4.9)
Alkaline Phosphatase: 66 IU/L (ref 44–121)
BUN/Creatinine Ratio: 16 (ref 9–20)
BUN: 15 mg/dL (ref 6–24)
Bilirubin Total: 0.6 mg/dL (ref 0.0–1.2)
CO2: 22 mmol/L (ref 20–29)
Calcium: 9.4 mg/dL (ref 8.7–10.2)
Chloride: 103 mmol/L (ref 96–106)
Creatinine, Ser: 0.95 mg/dL (ref 0.76–1.27)
Globulin, Total: 2.1 g/dL (ref 1.5–4.5)
Glucose: 91 mg/dL (ref 65–99)
Potassium: 4.9 mmol/L (ref 3.5–5.2)
Sodium: 141 mmol/L (ref 134–144)
Total Protein: 6.4 g/dL (ref 6.0–8.5)
eGFR: 92 mL/min/{1.73_m2} (ref >59)

## 2020-10-04 LAB — ROCKY MTN SPOTTED FVR ABS PNL(IGG+IGM)
RMSF IgG: UNDETERMINED
RMSF IgM: 0.65 index (ref 0.00–0.89)

## 2020-10-04 LAB — LIPID PANEL WITH LDL/HDL RATIO
Cholesterol, Total: 198 mg/dL (ref 100–199)
HDL: 53 mg/dL (ref >39)
LDL Chol Calc (NIH): 124 mg/dL — ABNORMAL HIGH (ref 0–99)
LDL/HDL Ratio: 2.3 ratio (ref 0.0–3.6)
Triglycerides: 116 mg/dL (ref 0–149)
VLDL Cholesterol Cal: 21 mg/dL (ref 5–40)

## 2020-10-04 LAB — TSH: TSH: 2.49 u[IU]/mL (ref 0.450–4.500)

## 2020-10-04 LAB — HEMOGLOBIN A1C
Est. average glucose Bld gHb Est-mCnc: 117 mg/dL
Hgb A1c MFr Bld: 5.7 % — ABNORMAL HIGH (ref 4.8–5.6)

## 2020-10-04 LAB — RMSF, IGG, IFA: RMSF, IGG, IFA: <1:64 {titer}

## 2020-10-04 LAB — PSA: Prostate Specific Ag, Serum: 1 ng/mL (ref 0.0–4.0)

## 2020-10-04 NOTE — Telephone Encounter (Signed)
Called patient and he stated he will call back to reschedule the appointment and he will reach out to [place] so that he want be charged for the EKG.

## 2020-10-04 NOTE — Progress Notes (Signed)
Patient reviewed message/results via mychart.

## 2020-10-04 NOTE — Telephone Encounter (Signed)
Can we call and schedule him with Dr. Caryn Section for a repeat EKG please?

## 2020-10-08 ENCOUNTER — Encounter: Payer: Self-pay | Admitting: Physician Assistant

## 2020-10-16 ENCOUNTER — Telehealth: Payer: Self-pay

## 2020-10-16 ENCOUNTER — Telehealth (INDEPENDENT_AMBULATORY_CARE_PROVIDER_SITE_OTHER): Payer: Self-pay | Admitting: Gastroenterology

## 2020-10-16 ENCOUNTER — Other Ambulatory Visit: Payer: Self-pay

## 2020-10-16 DIAGNOSIS — Z86010 Personal history of colonic polyps: Secondary | ICD-10-CM

## 2020-10-16 MED ORDER — PEG 3350-KCL-NA BICARB-NACL 420 G PO SOLR: 4000.0000 mL | Freq: Once | ORAL | 0 refills | Status: AC

## 2020-10-16 NOTE — Progress Notes (Signed)
Gastroenterology Pre-Procedure Review  Request Date: Tuesday 12/12/20 Requesting Physician: Dr. Vicente Males  PATIENT REVIEW QUESTIONS: The patient responded to the following health history questions as indicated:    1. Are you having any GI issues? no 2. Do you have a personal history of Polyps? yes (last colonoscopy performed by Dr. Fleet Contras 12/12/15 ) 3. Do you have a family history of Colon Cancer or Polyps? no 4. Diabetes Mellitus? no 5. Joint replacements in the past 12 months?no 6. Major health problems in the past 3 months?no 7. Any artificial heart valves, MVP, or defibrillator?no    MEDICATIONS & ALLERGIES:    Patient reports the following regarding taking any anticoagulation/antiplatelet therapy:   Plavix, Coumadin, Eliquis, Xarelto, Lovenox, Pradaxa, Brilinta, or Effient? no Aspirin? no  Patient confirms/reports the following medications:  No current outpatient medications on file.   No current facility-administered medications for this visit.    Patient confirms/reports the following allergies:  Allergies  Allergen Reactions  . Bee Venom Swelling    No orders of the defined types were placed in this encounter.   AUTHORIZATION INFORMATION Primary Insurance: 1D#: Group #:  Secondary Insurance: 1D#: Group #:  SCHEDULE INFORMATION: Date: Tuesday 06/14/22Time: Location:[facility]

## 2020-10-17 ENCOUNTER — Other Ambulatory Visit: Payer: Self-pay

## 2020-10-17 MED ORDER — PEG 3350-KCL-NA BICARB-NACL 420 G PO SOLR: 4000.0000 mL | Freq: Once | ORAL | 0 refills | Status: AC

## 2020-10-20 ENCOUNTER — Ambulatory Visit: Payer: Self-pay | Admitting: Cardiology

## 2020-10-26 ENCOUNTER — Telehealth: Payer: Self-pay

## 2020-10-26 ENCOUNTER — Encounter: Payer: Self-pay | Admitting: Cardiology

## 2020-10-26 ENCOUNTER — Ambulatory Visit: Payer: BC Managed Care – PPO | Admitting: Cardiology

## 2020-10-26 ENCOUNTER — Other Ambulatory Visit: Payer: Self-pay

## 2020-10-26 VITALS — BP 112/70 | HR 64 | Ht 71.0 in | Wt 223.0 lb

## 2020-10-26 DIAGNOSIS — R9431 Abnormal electrocardiogram [ECG] [EKG]: Secondary | ICD-10-CM | POA: Diagnosis not present

## 2020-10-26 DIAGNOSIS — E7800 Pure hypercholesterolemia, unspecified: Secondary | ICD-10-CM | POA: Diagnosis not present

## 2020-10-26 NOTE — Telephone Encounter (Signed)
Copied from [place] [id number]. Topic: General - Other >> Oct 26, 2020  9:29 AM Yvette Rack wrote: Reason for CRM: Pt requests a copy of his most recent lab results. Pt also would like information of the last EKG he had before 2022. Pt requests call back

## 2020-10-26 NOTE — Patient Instructions (Signed)
Medication Instructions:   Your physician recommends that you continue on your current medications as directed. Please refer to the Current Medication list given to you today.  *If you need a refill on your cardiac medications before your next appointment, please call your pharmacy*   Lab Work:  None ordered    Testing/Procedures:  1.  Your physician has requested that you have an echocardiogram. Echocardiography is a painless test that uses sound waves to create images of your heart. It provides your doctor with information about the size and shape of your heart and how well your heart's chambers and valves are working. This procedure takes approximately one hour. There are no restrictions for this procedure.  2.  We will order CT coronary calcium score  $99 at our [facility] in [place]  Please call [place] at [phone] to schedule  [address], [place] [address]   Follow-Up: At [facility], you and your health needs are our priority.  As part of our continuing mission to provide you with exceptional heart care, we have created designated Provider Care Teams.  These Care Teams include your primary Cardiologist (physician) and Advanced Practice Providers (APPs -  Physician Assistants and Nurse Practitioners) who all work together to provide you with the care you need, when you need it.  We recommend signing up for the patient portal called "MyChart".  Sign up information is provided on this After Visit Summary.  MyChart is used to connect with patients for Virtual Visits (Telemedicine).  Patients are able to view lab/test results, encounter notes, upcoming appointments, etc.  Non-urgent messages can be sent to your provider as well.   To learn more about what you can do with MyChart, go to [email or web address].    Your next appointment:   Follow up after testing   The format for your next appointment:    In Person  Provider:   Kate Sable, MD   Other Instructions

## 2020-10-26 NOTE — Progress Notes (Signed)
Cardiology Office Note:    Date:  10/26/2020   ID:  Albert Carpenter, DOB October 20, 1960, MRN [id number]  PCP:  Burnette, Jennifer M, [place]  Cardiologist:  Kate Sable, MD  Advanced Practice Provider:  No care team member to display Electrophysiologist:  None       Referring MD: Florian Buff*   Chief Complaint  Patient presents with  . New Patient (Initial Visit)    Referred by PCP for abnormal EKG and Family History. Meds reviewed verbally with patient.    Albert Carpenter is a 60 y.o. male who is being seen today for the evaluation of abnormal EKG at the request of Florian Buff*.   History of Present Illness:    Albert Carpenter is a 60 y.o. male with a hx of hyperlipidemia who presents due to abnormal EKG.  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  He denies symptoms of chest pain, shortness of breath, palpitations, prior history of heart disease.  His father has a history of MI in his 75s.  Had a recent blood work showing abnormal LDL levels, low-cholesterol diet recommended.  Has no other concerns at this time.  Planning on a trip in about 2 weeks.  History reviewed. No pertinent past medical history.  Past Surgical History:  Procedure Laterality Date  . CHOLECYSTECTOMY  2013  . COLONOSCOPY  01/08/11  . COLONOSCOPY WITH PROPOFOL N/A 12/12/2015   Procedure: COLONOSCOPY WITH PROPOFOL;  Surgeon: Robert Bellow, MD;  Location: [facility] ENDOSCOPY;  Service: Endoscopy;  Laterality: N/A;  . CYST REMOVAL LEG Left 2010  . KNEE SURGERY Bilateral 2000's  . SHOULDER SURGERY Right 2010  . SYNOVECTOMY     due to complex medical bilateral meniscus tear  . TONSILLECTOMY  kid    Current Medications: No outpatient medications have been marked as taking for the 10/26/20 encounter (Office Visit) with Kate Sable, MD.      Allergies:   Bee venom   Social History   Socioeconomic History  . Marital status: Married    Spouse name: Not on file  . Number of children: Not on file  . Years of education: Not on file  . Highest education level: Not on file  Occupational History  . Not on file  Tobacco Use  . Smoking status: Never Smoker  . Smokeless tobacco: Never Used  Vaping Use  . Vaping Use: Never used  Substance and Sexual Activity  . Alcohol use: Yes    Comment: occasional  . Drug use: No  . Sexual activity: Not on file  Other Topics Concern  . Not on file  Social History Narrative  . Not on file   Social Determinants of Health   Financial Resource Strain: Not on file  Food Insecurity: Not on file  Transportation Needs: Not on file  Physical Activity: Not on file  Stress: Not on file  Social Connections: Not on file     Family History: The patient's family history includes Alzheimer's disease in his mother; Healthy in his daughter, son, and son; Heart attack in his father; Hepatitis in his sister; Obesity in his father. There is no history of Colon cancer or Prostate cancer.  ROS:   Please see the history of present illness.     All other systems reviewed and are negative.  EKGs/Labs/Other Studies Reviewed:    The  following studies were reviewed today:   EKG:  EKG is  ordered today.  The ekg ordered today demonstrates sinus rhythm, possible old inferior infarct.  Recent Labs: 09/28/2020: ALT 27; BUN 15; Creatinine, Ser 0.95; Hemoglobin 16.7; Platelets 201; Potassium 4.9; Sodium 141; TSH 2.490  Recent Lipid Panel    Component Value Date/Time   CHOL 198 09/28/2020 1006   TRIG 116 09/28/2020 1006   HDL 53 09/28/2020 1006   CHOLHDL 3.8 07/29/2019 1034   LDLCALC 124 (H) 09/28/2020 1006     Risk Assessment/Calculations:      Physical Exam:    VS:  BP 112/70 (BP Location: Left Arm, Patient Position: Sitting, Cuff Size: Normal)   Pulse 64   Ht 5\' 11"  (1.803 m)   Wt 223  lb (101.2 kg)   SpO2 95%   BMI 31.10 kg/m     Wt Readings from Last 3 Encounters:  10/26/20 223 lb (101.2 kg)  09/28/20 225 lb (102.1 kg)  02/08/20 241 lb (109.3 kg)     GEN:  Well nourished, well developed in no acute distress HEENT: Normal NECK: No JVD; No carotid bruits LYMPHATICS: No lymphadenopathy CARDIAC: RRR, no murmurs, rubs, gallops RESPIRATORY:  Clear to auscultation without rales, wheezing or rhonchi  ABDOMEN: Soft, non-tender, non-distended MUSCULOSKELETAL:  No edema; No deformity  SKIN: Warm and dry NEUROLOGIC:  Alert and oriented x 3 PSYCHIATRIC:  Normal affect   ASSESSMENT:    1. EKG abnormality   2. Pure hypercholesterolemia    PLAN:    In order of problems listed above:  1. Patient with EKG showing possible old inferior infarct.  He is clinically asymptomatic.  Will obtain echocardiogram to evaluate any wall motion abnormalities. 2. Abnormal LDL, low-cholesterol diet advised.  Due to family history of CAD in father, will obtain a coronary calcium scan.  We will recommend statin therapy based on calcium score.  Follow-up after echo and coronary calcium scan.    Medication Adjustments/Labs and Tests Ordered: Current medicines are reviewed at length with the patient today.  Concerns regarding medicines are outlined above.  Orders Placed This Encounter  Procedures  . CT CARDIAC SCORING  . EKG 12-Lead  . ECHOCARDIOGRAM COMPLETE   No orders of the defined types were placed in this encounter.   Patient Instructions  Medication Instructions:   Your physician recommends that you continue on your current medications as directed. Please refer to the Current Medication list given to you today.  *If you need a refill on your cardiac medications before your next appointment, please call your pharmacy*   Lab Work:  None ordered    Testing/Procedures:  1.  Your physician has requested that you have an echocardiogram. Echocardiography is a painless  test that uses sound waves to create images of your heart. It provides your doctor with information about the size and shape of your heart and how well your heart's chambers and valves are working. This procedure takes approximately one hour. There are no restrictions for this procedure.  2.  We will order CT coronary calcium score  $99 at our [facility] in [place]  Please call [place] at [phone] to schedule  [address] [place], [place] [address]   Follow-Up: At [facility], you and your health needs are our priority.  As part of our continuing mission to provide you with exceptional heart care, we have created designated Provider Care Teams.  These Care Teams include your primary Cardiologist (  physician) and Advanced Practice Providers (APPs -  Physician Assistants and Nurse Practitioners) who all work together to provide you with the care you need, when you need it.  We recommend signing up for the patient portal called "MyChart".  Sign up information is provided on this After Visit Summary.  MyChart is used to connect with patients for Virtual Visits (Telemedicine).  Patients are able to view lab/test results, encounter notes, upcoming appointments, etc.  Non-urgent messages can be sent to your provider as well.   To learn more about what you can do with MyChart, go to [email or web address].    Your next appointment:   Follow up after testing   The format for your next appointment:   In Person  Provider:   Kate Sable, MD   Other Instructions      Signed, Kate Sable, MD  10/26/2020 1:03 PM    [place]

## 2020-10-27 NOTE — Telephone Encounter (Signed)
Printed off documentation and notified patient, will leave at front desk for pick up. KW

## 2020-10-30 ENCOUNTER — Other Ambulatory Visit: Payer: Self-pay

## 2020-10-30 ENCOUNTER — Ambulatory Visit
Admission: RE | Admit: 2020-10-30 | Discharge: 2020-10-30 | Disposition: A | Discharge status: Home / Self Care | Payer: BC Managed Care – PPO | Source: Ambulatory Visit | Attending: Cardiology | Admitting: Cardiology

## 2020-10-30 ENCOUNTER — Telehealth: Payer: Self-pay

## 2020-10-30 DIAGNOSIS — Z8249 Family history of ischemic heart disease and other diseases of the circulatory system: Secondary | ICD-10-CM | POA: Insufficient documentation

## 2020-10-30 DIAGNOSIS — R9431 Abnormal electrocardiogram [ECG] [EKG]: Secondary | ICD-10-CM | POA: Insufficient documentation

## 2020-10-30 DIAGNOSIS — R911 Solitary pulmonary nodule: Secondary | ICD-10-CM | POA: Insufficient documentation

## 2020-10-30 NOTE — Telephone Encounter (Signed)
Spoke with patient on the phone he does not recall speaking with a woman named Threasa Beards he states that he already has colonoscopy scheduled with [facility]  on  12/12/20. KW

## 2020-10-30 NOTE — Telephone Encounter (Signed)
Copied from [place] [id number]. Topic: General - Other >> Oct 30, 2020  3:22 PM Yvette Rack wrote: Reason for CRM: Threasa Beards with [facility] stated she contacted patient on 10/03/20 to schedule a pre-op appt prior to having colonoscopy because it is their protocol. Threasa Beards stated patient was upset and they have not heard back from him. Threasa Beards stated she called patient again on 10/17/20 as a reminder but still has not heard back from the patient. Cb# [phone]

## 2020-10-30 NOTE — Telephone Encounter (Signed)
Noted  

## 2020-11-21 ENCOUNTER — Ambulatory Visit (INDEPENDENT_AMBULATORY_CARE_PROVIDER_SITE_OTHER): Payer: BC Managed Care – PPO

## 2020-11-21 ENCOUNTER — Other Ambulatory Visit: Payer: Self-pay

## 2020-11-21 DIAGNOSIS — R9431 Abnormal electrocardiogram [ECG] [EKG]: Secondary | ICD-10-CM | POA: Diagnosis not present

## 2020-11-21 LAB — ECHOCARDIOGRAM COMPLETE
AR max vel: 2.69 cm2
AV Area VTI: 2.9 cm2
AV Area mean vel: 2.52 cm2
AV Mean grad: 3 mmHg
AV Peak grad: 6.2 mmHg
Ao pk vel: 1.24 m/s
Area-P 1/2: 2.94 cm2
Calc EF: 70.5 %
P 1/2 time: 1072 msec
S' Lateral: 3.4 cm
Single Plane A2C EF: 71.1 %
Single Plane A4C EF: 72.4 %

## 2020-11-21 MED ORDER — PERFLUTREN LIPID MICROSPHERE
1.0000 mL | INTRAVENOUS | Status: AC | PRN
  Administered 2020-11-21: 2 mL via INTRAVENOUS

## 2020-11-23 ENCOUNTER — Ambulatory Visit: Payer: BC Managed Care – PPO | Admitting: Cardiology

## 2020-11-23 ENCOUNTER — Other Ambulatory Visit: Payer: Self-pay

## 2020-11-23 ENCOUNTER — Encounter: Payer: Self-pay | Admitting: Cardiology

## 2020-11-23 VITALS — BP 116/82 | HR 70 | Ht 71.0 in | Wt 223.0 lb

## 2020-11-23 DIAGNOSIS — I77810 Thoracic aortic ectasia: Secondary | ICD-10-CM

## 2020-11-23 DIAGNOSIS — E7800 Pure hypercholesterolemia, unspecified: Secondary | ICD-10-CM

## 2020-11-23 DIAGNOSIS — R9431 Abnormal electrocardiogram [ECG] [EKG]: Secondary | ICD-10-CM | POA: Diagnosis not present

## 2020-11-23 NOTE — Progress Notes (Signed)
Cardiology Office Note:    Date:  11/23/2020   ID:  Albert Carpenter, DOB March 25, 1961, MRN [id number]  PCP:  Burnette, Jennifer M, [place]  Cardiologist:  Kate Sable, MD  Advanced Practice Provider:  No care team member to display Electrophysiologist:  None       Referring MD: Florian Buff*   Chief Complaint  Patient presents with  . Other    Follow up post ECHO - Meds reviewed verbally with patient.      History of Present Illness:    Albert Carpenter is a 60 y.o. male with a hx of hyperlipidemia who presents for follow-up.  Patient was previously seen due to abnormal EKG, showing possible old inferior infarct.  Denies any chest pain or shortness of breath.  Father has a history of MI in his 32s.  Due to risk factors, echocardiogram was obtained, calcium score also obtained to evaluate event risk.  He feels well otherwise, has no concerns at this time.  Presents for echocardiogram results.   History reviewed. No pertinent past medical history.  Past Surgical History:  Procedure Laterality Date  . CHOLECYSTECTOMY  2013  . COLONOSCOPY  01/08/11  . COLONOSCOPY WITH PROPOFOL N/A 12/12/2015   Procedure: COLONOSCOPY WITH PROPOFOL;  Surgeon: Robert Bellow, MD;  Location: [facility] ENDOSCOPY;  Service: Endoscopy;  Laterality: N/A;  . CYST REMOVAL LEG Left 2010  . KNEE SURGERY Bilateral 2000's  . SHOULDER SURGERY Right 2010  . SYNOVECTOMY     due to complex medical bilateral meniscus tear  . TONSILLECTOMY  kid    Current Medications: No outpatient medications have been marked as taking for the 11/23/20 encounter (Office Visit) with Kate Sable, MD.     Allergies:   Bee venom   Social History   Socioeconomic History  . Marital status: Married    Spouse name: Not on file  . Number of children: Not on file  . Years of education: Not on file  . Highest education level: Not on file  Occupational History  . Not on  file  Tobacco Use  . Smoking status: Never Smoker  . Smokeless tobacco: Never Used  Vaping Use  . Vaping Use: Never used  Substance and Sexual Activity  . Alcohol use: Yes    Comment: occasional  . Drug use: No  . Sexual activity: Not on file  Other Topics Concern  . Not on file  Social History Narrative  . Not on file   Social Determinants of Health   Financial Resource Strain: Not on file  Food Insecurity: Not on file  Transportation Needs: Not on file  Physical Activity: Not on file  Stress: Not on file  Social Connections: Not on file     Family History: The patient's family history includes Alzheimer's disease in his mother; Healthy in his daughter, son, and son; Heart attack in his father; Hepatitis in his sister; Obesity in his father. There is no history of Colon cancer or Prostate cancer.  ROS:   Please see the history of present illness.     All other systems reviewed and are negative.  EKGs/Labs/Other Studies Reviewed:    The following studies were reviewed today:   EKG:  EKG not ordered today.    Recent Labs: 09/28/2020: ALT 27; BUN 15; Creatinine, Ser 0.95; Hemoglobin 16.7; Platelets 201; Potassium 4.9; Sodium 141; TSH 2.490  Recent Lipid Panel    Component Value Date/Time  CHOL 198 09/28/2020 1006   TRIG 116 09/28/2020 1006   HDL 53 09/28/2020 1006   CHOLHDL 3.8 07/29/2019 1034   LDLCALC 124 (H) 09/28/2020 1006     Risk Assessment/Calculations:      Physical Exam:    VS:  BP 116/82 (BP Location: Left Arm, Patient Position: Sitting, Cuff Size: Normal)   Pulse 70   Ht 5\' 11"  (1.803 m)   Wt 223 lb (101.2 kg)   SpO2 97%   BMI 31.10 kg/m     Wt Readings from Last 3 Encounters:  11/23/20 223 lb (101.2 kg)  10/26/20 223 lb (101.2 kg)  09/28/20 225 lb (102.1 kg)     GEN:  Well nourished, well developed in no acute distress HEENT: Normal NECK: No JVD; No carotid bruits LYMPHATICS: No lymphadenopathy CARDIAC: RRR, no murmurs, rubs,  gallops RESPIRATORY:  Clear to auscultation without rales, wheezing or rhonchi  ABDOMEN: Soft, non-tender, non-distended MUSCULOSKELETAL:  No edema; No deformity  SKIN: Warm and dry NEUROLOGIC:  Alert and oriented x 3 PSYCHIATRIC:  Normal affect   ASSESSMENT:    1. EKG abnormality   2. Pure hypercholesterolemia   3. Aortic root dilation (HCC)    PLAN:    In order of problems listed above:  1. Patient with EKG showing possible old inferior infarct.  He is clinically asymptomatic.  Echocardiogram 10/2020 showed normal systolic function, normal wall motion.  Mild aortic root dilatation measuring 39 mm.  Overall okay echo, no findings to suggest previous MI.  Patient made aware of results and reassured. 2. Abnormal LDL.  family history of CAD in father, coronary calcium score of 12, 50th percentile for age.  Recommend low-cholesterol diet, no indication for aspirin or statin therapy at this time. 3. Mild aortic root dilatation, plan for serial monitoring with echocardiogram.  Follow-up after repeat echocardiogram in 2 years.    Medication Adjustments/Labs and Tests Ordered: Current medicines are reviewed at length with the patient today.  Concerns regarding medicines are outlined above.  Orders Placed This Encounter  Procedures  . ECHOCARDIOGRAM COMPLETE   No orders of the defined types were placed in this encounter.   Patient Instructions  Medication Instructions:  Please continue your current medications   *If you need a refill on your cardiac medications before your next appointment, please call your pharmacy*   Lab Work: None  Testing/Procedures: 2 years (May 2024) Your physician has requested that you have an echocardiogram. Echocardiography is a painless test that uses sound waves to create images of your heart. It provides your doctor with information about the size and shape of your heart and how well your heart's chambers and valves are working. This procedure takes  approximately one hour. There are no restrictions for this procedure.  There is a possibility that an IV may need to be started during your test to inject an image enhancing agent. This is done to obtain more optimal pictures of your heart. Therefore we ask that you do at least drink some water prior to coming in to hydrate your veins.     Follow-Up: Your next appointment:   2 year(s)  The format for your next appointment:   In Person  Provider:   You may see Kate Sable, MD or one of the following Advanced Practice Providers on your designated Care Team:    Murray Hodgkins, NP  Christell Faith, PA-C  Marrianne Mood, PA-C  Cadence [place], [place]  Laurann Montana, NP     Signed, Aaron Edelman  Agbor-Etang, MD  11/23/2020 12:55 PM    [facility]

## 2020-11-23 NOTE — Patient Instructions (Signed)
Medication Instructions:  Please continue your current medications   *If you need a refill on your cardiac medications before your next appointment, please call your pharmacy*   Lab Work: None  Testing/Procedures: 2 years (May 2024) Your physician has requested that you have an echocardiogram. Echocardiography is a painless test that uses sound waves to create images of your heart. It provides your doctor with information about the size and shape of your heart and how well your heart's chambers and valves are working. This procedure takes approximately one hour. There are no restrictions for this procedure.  There is a possibility that an IV may need to be started during your test to inject an image enhancing agent. This is done to obtain more optimal pictures of your heart. Therefore we ask that you do at least drink some water prior to coming in to hydrate your veins.     Follow-Up: Your next appointment:   2 year(s)  The format for your next appointment:   In Person  Provider:   You may see Kate Sable, MD or one of the following Advanced Practice Providers on your designated Care Team:    Murray Hodgkins, NP  Christell Faith, PA-C  Marrianne Mood, PA-C  Cadence [place], [place]  Laurann Montana, NP

## 2020-12-11 ENCOUNTER — Encounter: Payer: Self-pay | Admitting: Gastroenterology

## 2020-12-12 ENCOUNTER — Encounter
Admission: RE | Disposition: A | Discharge status: Home / Self Care | Payer: Self-pay | Source: Home / Self Care | Attending: Gastroenterology

## 2020-12-12 ENCOUNTER — Ambulatory Visit: Payer: BC Managed Care – PPO | Admitting: Anesthesiology

## 2020-12-12 ENCOUNTER — Ambulatory Visit
Admission: RE | Admit: 2020-12-12 | Discharge: 2020-12-12 | Disposition: A | Discharge status: Home / Self Care | Payer: BC Managed Care – PPO | Attending: Gastroenterology | Admitting: Gastroenterology

## 2020-12-12 ENCOUNTER — Encounter: Payer: Self-pay | Admitting: Gastroenterology

## 2020-12-12 DIAGNOSIS — Z8719 Personal history of other diseases of the digestive system: Secondary | ICD-10-CM | POA: Insufficient documentation

## 2020-12-12 DIAGNOSIS — Z9049 Acquired absence of other specified parts of digestive tract: Secondary | ICD-10-CM | POA: Diagnosis not present

## 2020-12-12 DIAGNOSIS — Z86010 Personal history of colonic polyps: Secondary | ICD-10-CM

## 2020-12-12 DIAGNOSIS — Z1211 Encounter for screening for malignant neoplasm of colon: Secondary | ICD-10-CM | POA: Diagnosis not present

## 2020-12-12 DIAGNOSIS — K5730 Diverticulosis of large intestine without perforation or abscess without bleeding: Secondary | ICD-10-CM | POA: Insufficient documentation

## 2020-12-12 DIAGNOSIS — Z91030 Bee allergy status: Secondary | ICD-10-CM | POA: Insufficient documentation

## 2020-12-12 HISTORY — PX: COLONOSCOPY WITH PROPOFOL: SHX5780

## 2020-12-12 SURGERY — COLONOSCOPY WITH PROPOFOL
Anesthesia: General

## 2020-12-12 MED ORDER — LIDOCAINE HCL (CARDIAC) PF 100 MG/5ML IV SOSY
PREFILLED_SYRINGE | INTRAVENOUS | Status: DC | PRN
  Administered 2020-12-12: 50 mg via INTRAVENOUS

## 2020-12-12 MED ORDER — PROPOFOL 500 MG/50ML IV EMUL
INTRAVENOUS | Status: DC | PRN
  Administered 2020-12-12: 150 ug/kg/min via INTRAVENOUS

## 2020-12-12 MED ORDER — PROPOFOL 500 MG/50ML IV EMUL
INTRAVENOUS | Status: AC
  Filled 2020-12-12: qty 50

## 2020-12-12 MED ORDER — SODIUM CHLORIDE 0.9 % IV SOLN: INTRAVENOUS | Status: DC

## 2020-12-12 MED ORDER — PROPOFOL 10 MG/ML IV BOLUS
INTRAVENOUS | Status: DC | PRN
  Administered 2020-12-12: 60 mg via INTRAVENOUS

## 2020-12-12 NOTE — H&P (Signed)
Jonathon Bellows, MD [address], [place], [place], [place], [address] [address], [place], [place], [place], [address] Phone: [phone]  Fax: [phone]  Primary Care Physician:  Mar Daring, PA-C   Pre-Procedure History & Physical: HPI:  Albert Carpenter is a 60 y.o. male is here for an colonoscopy.   History reviewed. No pertinent past medical history.  Past Surgical History:  Procedure Laterality Date   CHOLECYSTECTOMY  2013   COLONOSCOPY  01/08/11   COLONOSCOPY WITH PROPOFOL N/A 12/12/2015   Procedure: COLONOSCOPY WITH PROPOFOL;  Surgeon: Robert Bellow, MD;  Location: [facility] ENDOSCOPY;  Service: Endoscopy;  Laterality: N/A;   CYST REMOVAL LEG Left 2010   KNEE SURGERY Bilateral 2000's   SHOULDER SURGERY Right 2010   SYNOVECTOMY     due to complex medical bilateral meniscus tear   TONSILLECTOMY  kid    Prior to Admission medications   Not on File    Allergies as of 10/16/2020 - Review Complete 10/16/2020  Allergen Reaction Noted   Bee venom Swelling 10/12/2012    Family History  Problem Relation Age of Onset   Alzheimer's disease Mother    Heart attack Father    Obesity Father    Hepatitis Sister    Healthy Son    Healthy Son    Healthy Daughter    Colon cancer Neg Hx    Prostate cancer Neg Hx     Social History   Socioeconomic History   Marital status: Married    Spouse name: Not on file   Number of children: Not on file   Years of education: Not on file   Highest education level: Not on file  Occupational History   Not on file  Tobacco Use   Smoking status: Never   Smokeless tobacco: Never  Vaping Use   Vaping Use: Never used  Substance and Sexual Activity   Alcohol use: Yes    Comment: occasional   Drug use: No   Sexual activity: Not on file  Other Topics Concern   Not on file  Social History Narrative   Not on file   Social Determinants of Health   Financial Resource Strain: Not on file  Food Insecurity: Not on  file  Transportation Needs: Not on file  Physical Activity: Not on file  Stress: Not on file  Social Connections: Not on file  Intimate Partner Violence: Not on file    Review of Systems: See HPI, otherwise negative ROS  Physical Exam: BP (!) 116/92   Pulse 77   Temp (!) 97.3 F (36.3 C) (Temporal)   Resp 18   Ht 5\' 11"  (1.803 m)   Wt 98 kg   SpO2 95%   BMI 30.13 kg/m  General:   Alert,  pleasant and cooperative in NAD Head:  Normocephalic and atraumatic. Neck:  Supple; no masses or thyromegaly. Lungs:  Clear throughout to auscultation, normal respiratory effort.    Heart:  +S1, +S2, Regular rate and rhythm, No edema. Abdomen:  Soft, nontender and nondistended. Normal bowel sounds, without guarding, and without rebound.   Neurologic:  Alert and  oriented x4;  grossly normal neurologically.  Impression/Plan: Albert Carpenter is here for an colonoscopy to be performed for surveillance due to prior history of colon polyps   Risks, benefits, limitations, and alternatives regarding  colonoscopy have been reviewed with the patient.  Questions have been answered.  All parties agreeable.   Jonathon Bellows, MD  12/12/2020, 7:32 AM

## 2020-12-12 NOTE — Anesthesia Preprocedure Evaluation (Addendum)
Anesthesia Evaluation  Patient identified by MRN, date of birth, ID band Patient awake    Reviewed: Allergy & Precautions, NPO status , Patient's Chart, lab work & pertinent test results  History of Anesthesia Complications Negative for: history of anesthetic complications  Airway Mallampati: II  TM Distance: >3 FB Neck ROM: Full    Dental no notable dental hx. (+) Teeth Intact   Pulmonary neg pulmonary ROS, neg sleep apnea, neg COPD, Patient abstained from smoking.Not current smoker,    Pulmonary exam normal breath sounds clear to auscultation       Cardiovascular Exercise Tolerance: Good METS(-) hypertension(-) CAD and (-) Past MI negative cardio ROS  (-) dysrhythmias  Rhythm:Regular Rate:Normal - Systolic murmurs Normal echo   Neuro/Psych negative neurological ROS  negative psych ROS   GI/Hepatic neg GERD  ,(+)     (-) substance abuse  ,   Endo/Other  neg diabetes  Renal/GU negative Renal ROS     Musculoskeletal   Abdominal   Peds  Hematology   Anesthesia Other Findings History reviewed. No pertinent past medical history.  Reproductive/Obstetrics                            Anesthesia Physical Anesthesia Plan  ASA: 1  Anesthesia Plan: General   Post-op Pain Management:    Induction: Intravenous  PONV Risk Score and Plan: 2 and Ondansetron, Propofol infusion and TIVA  Airway Management Planned: Nasal Cannula  Additional Equipment: None  Intra-op Plan:   Post-operative Plan:   Informed Consent: I have reviewed the patients History and Physical, chart, labs and discussed the procedure including the risks, benefits and alternatives for the proposed anesthesia with the patient or authorized representative who has indicated his/her understanding and acceptance.     Dental advisory given  Plan Discussed with: CRNA and Surgeon  Anesthesia Plan Comments: (Discussed risks  of anesthesia with patient, including possibility of difficulty with spontaneous ventilation under anesthesia necessitating airway intervention, PONV, and rare risks such as cardiac or respiratory or neurological events. Patient understands.)        Anesthesia Quick Evaluation

## 2020-12-12 NOTE — Anesthesia Postprocedure Evaluation (Signed)
Anesthesia Post Note  Patient: Albert Carpenter  Procedure(s) Performed: COLONOSCOPY WITH PROPOFOL  Patient location during evaluation: Endoscopy Anesthesia Type: General Level of consciousness: awake and alert Pain management: pain level controlled Vital Signs Assessment: post-procedure vital signs reviewed and stable Respiratory status: spontaneous breathing, nonlabored ventilation, respiratory function stable and patient connected to nasal cannula oxygen Cardiovascular status: blood pressure returned to baseline and stable Postop Assessment: no apparent nausea or vomiting Anesthetic complications: no   No notable events documented.   Last Vitals:  Vitals:   12/12/20 0803 12/12/20 0813  BP:  110/65  Pulse: (!) 59 (!) 53  Resp: 13 15  Temp: (!) 36.2 C   SpO2: 94% 96%    Last Pain:  Vitals:   12/12/20 0813  TempSrc:   PainSc: 0-No pain                 Arita Miss

## 2020-12-12 NOTE — Transfer of Care (Signed)
Immediate Anesthesia Transfer of Care Note  Patient: Albert Carpenter  Procedure(s) Performed: COLONOSCOPY WITH PROPOFOL  Patient Location: PACU and Endoscopy Unit  Anesthesia Type:General  Level of Consciousness: drowsy and patient cooperative  Airway & Oxygen Therapy: Patient Spontanous Breathing  Post-op Assessment: Report given to RN and Post -op Vital signs reviewed and stable  Post vital signs: Reviewed and stable  Last Vitals:  Vitals Value Taken Time  BP 88/48 12/12/20 0804  Temp 36.2 C 12/12/20 0803  Pulse 57 12/12/20 0805  Resp 15 12/12/20 0805  SpO2 95 % 12/12/20 0805  Vitals shown include unvalidated device data.  Last Pain:  Vitals:   12/12/20 0803  TempSrc: Temporal  PainSc: Asleep         Complications: No notable events documented.

## 2020-12-12 NOTE — Anesthesia Procedure Notes (Signed)
Procedure Name: MAC Date/Time: 12/12/2020 7:33 AM Performed by: Jerrye Noble, CRNA Pre-anesthesia Checklist: Patient identified, Suction available, Emergency Drugs available and Patient being monitored Patient Re-evaluated:Patient Re-evaluated prior to induction Oxygen Delivery Method: Nasal cannula

## 2020-12-12 NOTE — Op Note (Signed)
[facility] Patient Name: Albert Carpenter Procedure Date: 12/12/2020 7:34 AM MRN: [id number] Account #: [id number] Date of Birth: 06-22-1961 Admit Type: Outpatient Age: 60 Room: [facility] ENDO ROOM 2 Gender: Male Note Status: Finalized Procedure:             Colonoscopy Indications:           High risk colon cancer surveillance: Personal history                         of colonic polyps, Last colonoscopy: June 2017 Providers:             Jonathon Bellows MD, MD Medicines:             Monitored Anesthesia Care Complications:         No immediate complications. Procedure:             Pre-Anesthesia Assessment:                        - Prior to the procedure, a History and Physical was                         performed, and patient medications, allergies and                         sensitivities were reviewed. The patient's tolerance                         of previous anesthesia was reviewed.                        - The risks and benefits of the procedure and the                         sedation options and risks were discussed with the                         patient. All questions were answered and informed                         consent was obtained.                        - ASA Grade Assessment: II - A patient with mild                         systemic disease.                        After obtaining informed consent, the colonoscope was                         passed under direct vision. Throughout the procedure,                         the patient's blood pressure, pulse, and oxygen                         saturations were monitored continuously. The  Colonoscope was introduced through the anus and                         advanced to the the cecum, identified by the                         appendiceal orifice. The colonoscopy was performed                         with ease. The patient tolerated the procedure well.                          The quality of the bowel preparation was adequate. Findings:      The perianal and digital rectal examinations were normal.      Multiple small-mouthed diverticula were found in the entire colon.      The exam was otherwise without abnormality on direct and retroflexion       views. Impression:            - Diverticulosis in the entire examined colon.                        - The examination was otherwise normal on direct and                         retroflexion views.                        - No specimens collected. Recommendation:        - Discharge patient to home (with escort).                        - Resume previous diet.                        - Continue present medications.                        - Repeat colonoscopy in 5 years for surveillance. Procedure Code(s):     --- Professional ---                        [phone], Colonoscopy, flexible; diagnostic, including                         collection of specimen(s) by brushing or washing, when                         performed (separate procedure) Diagnosis Code(s):     --- Professional ---                        Z86.010, Personal history of colonic polyps                        K57.30, Diverticulosis of large intestine without                         perforation or abscess without bleeding CPT copyright 2019 American Medical Association. All rights reserved. The codes documented in this report are preliminary and upon coder  review may  be revised to meet current compliance requirements. Jonathon Bellows, MD Jonathon Bellows MD, MD 12/12/2020 8:01:01 AM This report has been signed electronically. Number of Addenda: 0 Note Initiated On: 12/12/2020 7:34 AM Scope Withdrawal Time: 0 hours 18 minutes 7 seconds  Total Procedure Duration: 0 hours 22 minutes 16 seconds  Estimated Blood Loss:  Estimated blood loss: none.      [facility]

## 2020-12-13 ENCOUNTER — Encounter: Payer: Self-pay | Admitting: Gastroenterology

## 2021-03-21 ENCOUNTER — Telehealth: Payer: Self-pay

## 2021-03-21 NOTE — Telephone Encounter (Signed)
Copied from [place] [phone]. Topic: General - Other >> Mar 21, 2021  8:21 AM Albert Carpenter wrote: Reason for CRM: Pt called in stating he and his wife has been loyal pts here at the office for many years, and that he just recently moved his mother to [facility] and wanted to see about getting her set up as a new patient, I did inform pt that the office was not accepting new pt at the moment, but pt requested if someone give him a call back about this, please advise.

## 2021-06-02 ENCOUNTER — Emergency Department: Payer: BC Managed Care – PPO

## 2021-06-02 ENCOUNTER — Observation Stay
Admission: EM | Admit: 2021-06-02 | Discharge: 2021-06-03 | Disposition: A | Discharge status: Home / Self Care | Payer: BC Managed Care – PPO | Attending: Internal Medicine | Admitting: Internal Medicine

## 2021-06-02 ENCOUNTER — Encounter: Payer: Self-pay | Admitting: Family Medicine

## 2021-06-02 DIAGNOSIS — Z20822 Contact with and (suspected) exposure to covid-19: Secondary | ICD-10-CM | POA: Diagnosis not present

## 2021-06-02 DIAGNOSIS — G459 Transient cerebral ischemic attack, unspecified: Principal | ICD-10-CM | POA: Insufficient documentation

## 2021-06-02 DIAGNOSIS — R200 Anesthesia of skin: Secondary | ICD-10-CM | POA: Diagnosis present

## 2021-06-02 LAB — COMPREHENSIVE METABOLIC PANEL
ALT: 30 U/L (ref 0–44)
AST: 23 U/L (ref 15–41)
Albumin: 3.9 g/dL (ref 3.5–5.0)
Alkaline Phosphatase: 58 U/L (ref 38–126)
Anion gap: 6 (ref 5–15)
BUN: 20 mg/dL (ref 6–20)
CO2: 26 mmol/L (ref 22–32)
Calcium: 8.4 mg/dL — ABNORMAL LOW (ref 8.9–10.3)
Chloride: 105 mmol/L (ref 98–111)
Creatinine, Ser: 0.86 mg/dL (ref 0.61–1.24)
GFR, Estimated: >60 mL/min (ref >60)
Glucose, Bld: 99 mg/dL (ref 70–99)
Potassium: 3.8 mmol/L (ref 3.5–5.1)
Sodium: 137 mmol/L (ref 135–145)
Total Bilirubin: 0.8 mg/dL (ref 0.3–1.2)
Total Protein: 6.5 g/dL (ref 6.5–8.1)

## 2021-06-02 LAB — CBC
HCT: 46.2 % (ref 39.0–52.0)
Hemoglobin: 15.9 g/dL (ref 13.0–17.0)
MCH: 31.1 pg (ref 26.0–34.0)
MCHC: 34.4 g/dL (ref 30.0–36.0)
MCV: 90.4 fL (ref 80.0–100.0)
Platelets: 182 10*3/uL (ref 150–400)
RBC: 5.11 MIL/uL (ref 4.22–5.81)
RDW: 12.6 % (ref 11.5–15.5)
WBC: 6.8 10*3/uL (ref 4.0–10.5)
nRBC: 0 % (ref 0.0–0.2)

## 2021-06-02 LAB — APTT: aPTT: 38 seconds — ABNORMAL HIGH (ref 24–36)

## 2021-06-02 LAB — DIFFERENTIAL
Abs Immature Granulocytes: 0.03 10*3/uL (ref 0.00–0.07)
Basophils Absolute: 0.1 10*3/uL (ref 0.0–0.1)
Basophils Relative: 1 %
Eosinophils Absolute: 0.2 10*3/uL (ref 0.0–0.5)
Eosinophils Relative: 3 %
Immature Granulocytes: 0 %
Lymphocytes Relative: 31 %
Lymphs Abs: 2.1 10*3/uL (ref 0.7–4.0)
Monocytes Absolute: 0.5 10*3/uL (ref 0.1–1.0)
Monocytes Relative: 7 %
Neutro Abs: 3.9 10*3/uL (ref 1.7–7.7)
Neutrophils Relative %: 58 %

## 2021-06-02 LAB — PROTIME-INR
INR: 1 (ref 0.8–1.2)
Prothrombin Time: 12.8 seconds (ref 11.4–15.2)

## 2021-06-02 LAB — CBG MONITORING, ED: Glucose-Capillary: 71 mg/dL (ref 70–99)

## 2021-06-02 LAB — RESP PANEL BY RT-PCR (FLU A&B, COVID) ARPGX2
Influenza A by PCR: NEGATIVE
Influenza B by PCR: NEGATIVE
SARS Coronavirus 2 by RT PCR: NEGATIVE

## 2021-06-02 MED ORDER — STROKE: EARLY STAGES OF RECOVERY BOOK: Freq: Once | Status: AC

## 2021-06-02 MED ORDER — CLOPIDOGREL BISULFATE 75 MG PO TABS
75.0000 mg | ORAL_TABLET | Freq: Every day | ORAL | Status: DC
  Administered 2021-06-03: 09:00:00 75 mg via ORAL
  Filled 2021-06-02: qty 1

## 2021-06-02 MED ORDER — ACETAMINOPHEN 650 MG RE SUPP: 650.0000 mg | RECTAL | Status: DC | PRN

## 2021-06-02 MED ORDER — ACETAMINOPHEN 325 MG PO TABS: 650.0000 mg | ORAL_TABLET | ORAL | Status: DC | PRN

## 2021-06-02 MED ORDER — CLOPIDOGREL BISULFATE 75 MG PO TABS
300.0000 mg | ORAL_TABLET | Freq: Once | ORAL | Status: AC
  Administered 2021-06-02: 300 mg via ORAL
  Filled 2021-06-02: qty 4

## 2021-06-02 MED ORDER — IOHEXOL 350 MG/ML SOLN
75.0000 mL | Freq: Once | INTRAVENOUS | Status: AC | PRN
  Administered 2021-06-02: 75 mL via INTRAVENOUS

## 2021-06-02 MED ORDER — ASPIRIN EC 81 MG PO TBEC
81.0000 mg | DELAYED_RELEASE_TABLET | Freq: Every day | ORAL | Status: DC
  Administered 2021-06-02 – 2021-06-03 (×2): 81 mg via ORAL
  Filled 2021-06-02 (×2): qty 1

## 2021-06-02 MED ORDER — SODIUM CHLORIDE 0.9% FLUSH
3.0000 mL | Freq: Once | INTRAVENOUS | Status: AC
  Administered 2021-06-02: 3 mL via INTRAVENOUS

## 2021-06-02 MED ORDER — ACETAMINOPHEN 160 MG/5ML PO SOLN
650.0000 mg | ORAL | Status: DC | PRN
  Filled 2021-06-02: qty 20.3

## 2021-06-02 NOTE — Consult Note (Signed)
[facility] Telemedicine Consult   Requesting Provider: Joni Fears, [place] Consult Participants: Patient, bedside nurse Location of the provider: [place], [place] Location of the patient: [facility]  This consult was provided via telemedicine with 2-way video and audio communication. The patient/family was informed that care would be provided in this way and agreed to receive care in this manner.    Chief Complaint: Left-sided numbness  HPI: 60 year old male with no past medical history who presents with left-sided numbness that started abruptly this afternoon.  He was in his normal state of health and then around 330 he started noticing problems with his left side.  Started on his left flank and within a minute spread to his face and arm as well.  He did have some bifrontal headache, but this is resolved and the numbness is persistent.    LKW: 3:30 PM tpa given?: No, mild symptoms IR Thrombectomy? No, mild symptoms Modified Rankin Scale: 0-Completely asymptomatic and back to baseline post- stroke Time of page: 16:21(paged as code stroke, cart not activated or in room until shortly before I evaluated him) Time of teleneurologist evaluation: 16:53 NIHSS: 16:57  Exam: Vitals:   06/02/21 1619 06/02/21 1637  BP: (!) 155/95 (!) 157/94  Pulse: 69 74  Resp: 20 20  SpO2: 100% 100%    General: In bed, NAD  1A: Level of Consciousness - 0 1B: Ask Month and Age - 0 1C: 'Blink Eyes' & 'Squeeze Hands' - 0 2: Test Horizontal Extraocular Movements - 0 3: Test Visual Fields - 0 4: Test Facial Palsy - 0 5A: Test Left Arm Motor Drift - 0 5B: Test Right Arm Motor Drift - 0 6A: Test Left Leg Motor Drift - 0 6B: Test Right Leg Motor Drift - 0 7: Test Limb Ataxia - 0 8: Test Sensation - 1 9: Test Language/Aphasia- 0 10: Test Dysarthria - 0 11: Test Extinction/Inattention - 0 NIHSS score: 1   Imaging Reviewed: CT head-unremarkable  Labs reviewed in epic and  pertinent values follow: CBG 71   Assessment: 60 year old male with acute left-sided numbness.  With no significant past medical history of headaches, unlikely to be complicated migraine.  I suspect that this does likely represent small ischemic stroke, though with improvement in his symptoms it is possible this one to been a TIA.  I would favor further work-up as such and he will need to be admitted for observation and to expedite work-up. His ABCD2 is 4.   Recommendations:  - HgbA1c, fasting lipid panel - MRI of the brain without contrast - Frequent neuro checks - Echocardiogram - CTA head and neck - Prophylactic therapy-Antiplatelet med: Aspirin - dose 81mg  and plavix 75mg  daily  after 300mg  load  - Risk factor modification - Telemetry monitoring - PT consult, OT consult, Speech consult - Stroke team to follow     This patient is receiving care for possible acute neurological changes. There was 56 minutes of care by this provider at the time of service, including time for direct evaluation via telemedicine, review of medical records, imaging studies and discussion of findings with providers, the patient and/or family.  Roland Rack, MD [facility] [phone]  If 7pm- 7am, please page neurology on call as listed in [place].

## 2021-06-02 NOTE — ED Notes (Signed)
Pt to IP UNIT with RN

## 2021-06-02 NOTE — ED Notes (Signed)
Pt to CT 2

## 2021-06-02 NOTE — ED Triage Notes (Signed)
Pt endorsing that approx 30 minutes ago at 4pm that they started having numbness in their L abdomen that radiated quickly to their face, arm. Pt endorsing the tasting of metal and a headache. PT endorsing now that most of their symptoms have resolved except the skin on their L tricep and Left mouth and eye lid.

## 2021-06-02 NOTE — ED Triage Notes (Signed)
Pt to ED for numbness to left stomach, arm and face that started within last hour. Still currently has sx Clear speech  LKW 1530  Cbg 71

## 2021-06-02 NOTE — Progress Notes (Signed)
Chaplain Maggie responded to ED16 for Code Stroke. Patient not available. Continued support available per on call chaplain.

## 2021-06-02 NOTE — H&P (Signed)
[facility] History and Physical  Albert Carpenter WUJ:[id number] DOB: 12/09/1960 DOA: 06/02/2021  Referring physician: Dr. Joni Fears PCP: Mar Daring, PA-C   Chief Complaint: L sided numbness  HPI: Albert Carpenter is a 60 y.o. male with history of mild aortic root dilation, gallstones, diverticulosis, who presents with left-sided numbness.  Patient reports he was at home watching a world cup match earlier today when he suddenly developed an acute and noticeable numbness and tingling over the left side of his abdomen.  This subsequently spread up to include his left arm, left shoulder, left neck, and left face.  This occurred around 3:45 PM.  He waited a while and when his wife got home he told her about his symptoms and came to the hospital.  Patient denies any other symptoms, including chest pain, palpitations, nausea, diaphoresis, weakness, vision changes, or difficulty walking.  He is a never smoker.  He does not take any medications.  His father passed away from a stroke, no other family history of heart disease or stroke.  At the time of my interview around 6:15 PM patient reported that over the prior 15 minutes his symptoms had started to improve significantly.  In the ED initial vital signs notable only for mild hypertension ranging 140s to 150s over 80s to 90s.  Lab work-up was unremarkable, with normal CMP, CBC, coags.  Code stroke was initiated.  CT head was unremarkable.  Neurology was subsequently consulted, and recommended admission for work-up of possible stroke versus TIA.  Review of Systems:  Pertinent positives and negative per HPI, all others reviewed and negative  No past medical history on file. Past Surgical History:  Procedure Laterality Date   CHOLECYSTECTOMY  2013   COLONOSCOPY  01/08/11   COLONOSCOPY WITH PROPOFOL N/A 12/12/2015   Procedure: COLONOSCOPY WITH PROPOFOL;  Surgeon: Robert Bellow, MD;  Location: [facility] ENDOSCOPY;  Service:  Endoscopy;  Laterality: N/A;   COLONOSCOPY WITH PROPOFOL N/A 12/12/2020   Procedure: COLONOSCOPY WITH PROPOFOL;  Surgeon: Jonathon Bellows, MD;  Location: [facility] ENDOSCOPY;  Service: Gastroenterology;  Laterality: N/A;   CYST REMOVAL LEG Left 2010   KNEE SURGERY Bilateral 2000's   SHOULDER SURGERY Right 2010   SYNOVECTOMY     due to complex medical bilateral meniscus tear   TONSILLECTOMY  kid   Social History:  reports that he has never smoked. He has never used smokeless tobacco. He reports current alcohol use. He reports that he does not use drugs.  Allergies  Allergen Reactions   Bee Venom Swelling    Family History  Problem Relation Age of Onset   Alzheimer's disease Mother    Heart attack Father    Obesity Father    Hepatitis Sister    Healthy Son    Healthy Son    Healthy Daughter    Colon cancer Neg Hx    Prostate cancer Neg Hx      Prior to Admission medications   Not on File   Physical Exam: Vitals:   06/02/21 1638 06/02/21 1700 06/02/21 1730 06/02/21 1815  BP:  (!) 143/84 (!) 135/94 (!) 135/94  Pulse:  78 71   Resp:  20 17   SpO2:  96% 95%   Weight: 100.2 kg       Wt Readings from Last 3 Encounters:  06/02/21 100.2 kg  12/12/20 98 kg  11/23/20 101.2 kg     General:  Appears calm and comfortable Eyes: PERRL, normal lids, irises & conjunctiva ENT:  grossly normal hearing, lips & tongue Neck: no masses Cardiovascular: RRR, no m/r/g. No LE edema. Telemetry: SR, no arrhythmias  Respiratory: CTA bilaterally, no w/r/r. Normal respiratory effort. Abdomen: soft, ntnd Skin: no rash or induration seen on limited exam Musculoskeletal: grossly normal tone BUE/BLE Psychiatric: grossly normal mood and affect, speech fluent and appropriate Neurologic: grossly non-focal.  Strength 5 out of 5 in upper and lower extremities bilaterally.  Reports decreased sensation to light touch in left upper extremity and throughout left cranial nerve V distribution.  Remainder of cranial  nerves are intact.  Negative pronator drift.          Labs on Admission:  Basic Metabolic Panel: Recent Labs  Lab 06/02/21 1702  NA 137  K 3.8  CL 105  CO2 26  GLUCOSE 99  BUN 20  CREATININE 0.86  CALCIUM 8.4*   Liver Function Tests: Recent Labs  Lab 06/02/21 1702  AST 23  ALT 30  ALKPHOS 58  BILITOT 0.8  PROT 6.5  ALBUMIN 3.9   No results for input(s): LIPASE, AMYLASE in the last 168 hours. No results for input(s): AMMONIA in the last 168 hours. CBC: Recent Labs  Lab 06/02/21 1702  WBC 6.8  NEUTROABS 3.9  HGB 15.9  HCT 46.2  MCV 90.4  PLT 182   Cardiac Enzymes: No results for input(s): CKTOTAL, CKMB, CKMBINDEX, TROPONINI in the last 168 hours.  BNP (last 3 results) No results for input(s): BNP in the last 8760 hours.  ProBNP (last 3 results) No results for input(s): PROBNP in the last 8760 hours.  CBG: Recent Labs  Lab 06/02/21 1620  GLUCAP 71    Radiological Exams on Admission: CT ANGIO HEAD NECK W WO CM  Result Date: 06/02/2021 CLINICAL DATA:  Stroke, follow-up EXAM: CT ANGIOGRAPHY HEAD AND NECK TECHNIQUE: Multidetector CT imaging of the head and neck was performed using the standard protocol during bolus administration of intravenous contrast. Multiplanar CT image reconstructions and MIPs were obtained to evaluate the vascular anatomy. Carotid stenosis measurements (when applicable) are obtained utilizing NASCET criteria, using the distal internal carotid diameter as the denominator. CONTRAST:  78mL OMNIPAQUE IOHEXOL 350 MG/ML SOLN COMPARISON:  None. FINDINGS: CTA NECK Aortic arch: Great vessel origins are patent. Right carotid system: Patent. There is probable small web at the ICA origin. No stenosis. Left carotid system: Patent.  No stenosis. Vertebral arteries: Patent and codominant.  No stenosis. Skeleton: Cervical spine degenerative changes, greatest at C5-C6 and C6-C7. Other neck: Unremarkable. Upper chest: Included upper lungs are clear. Review  of the MIP images confirms the above findings CTA HEAD Anterior circulation: Intracranial internal carotid arteries are patent with minor plaque. Anterior and middle cerebral arteries are patent. Posterior circulation: Intracranial vertebral arteries are patent. Basilar artery is patent. Major cerebellar artery origins are patent. Posterior cerebral arteries are patent. Venous sinuses: Patent as allowed by contrast bolus timing. Review of the MIP images confirms the above findings IMPRESSION: No large vessel occlusion, hemodynamically significant stenosis, or evidence of dissection. Electronically Signed   By: Macy Mis M.D.   On: 06/02/2021 18:13   CT HEAD CODE STROKE WO CONTRAST  Result Date: 06/02/2021 CLINICAL DATA:  Left-sided deficit EXAM: CT HEAD WITHOUT CONTRAST TECHNIQUE: Contiguous axial images were obtained from the base of the skull through the vertex without intravenous contrast. COMPARISON:  None. FINDINGS: Brain: There is no acute intracranial hemorrhage, mass effect, or edema. Gray-white differentiation is preserved. Ventricles and sulci are within normal limits in size and  configuration. Minimal patchy low-density in the right frontal subcortical white matter may reflect chronic microvascular ischemic changes. No extra-axial collection. Vascular: No hyperdense vessel. Skull: Unremarkable. Sinuses/Orbits: No significant opacification. No acute abnormality of the orbits. Other: Mastoid air cells are clear. ASPECTS (Ray Stroke Program Early CT Score) - Ganglionic level infarction (caudate, lentiform nuclei, internal capsule, insula, M1-M3 cortex): 7 - Supraganglionic infarction (M4-M6 cortex): 3 Total score (0-10 with 10 being normal): 10 IMPRESSION: There is no acute intracranial hemorrhage or evidence of acute infarction. ASPECT score is 10. Electronically Signed   By: Macy Mis M.D.   On: 06/02/2021 16:40    EKG: Independently reviewed.  Sinus rhythm, no acute ischemic changes,  no changes from prior.  Assessment/Plan Principal Problem:   TIA (transient ischemic attack)   Albert Carpenter is a 60 y.o. male with history of mild aortic root dilation, gallstones, diverticulosis, who presents with left-sided numbness concerning for CVA versus TIA.  #Acute left-sided paresthesias Concerning for TIA versus stroke, appreciate neurology input, work-up in process. - HgbA1c, fasting lipid panel - MRI of the brain without contrast - Frequent neuro checks - Echocardiogram - CTA head and neck - Prophylactic therapy-Antiplatelet med: Aspirin - dose 81mg  and plavix 75mg  daily  after 300mg  load  - Risk factor modification - Telemetry monitoring - PT consult, OT consult, Speech consult   Code Status: Full code, presumed DVT Prophylaxis: SCDs Family Communication: None Disposition Plan: Observation, MedSurg  Time spent: 50 min  Clarnce Flock MD/MPH [facility]  Note:  This document was prepared using Systems analyst and may include unintentional dictation errors.

## 2021-06-02 NOTE — ED Provider Notes (Signed)
[facility] Emergency Department Provider Note  ____________________________________________  Time seen: Approximately 6:01 PM  I have reviewed the triage vital signs and the nursing notes.   HISTORY  Chief Complaint Numbness    HPI Albert Carpenter is a 60 y.o. male with no significant past medical history who comes ED complaining of left sided paresthesia since 4:00 PM, started suddenly, constant, no aggravating or alleviating factors.  He denies motor weakness or loss of balance or coordination.  No headache or vision change.  No recent trauma.  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   Diverticulosis of colon 07/27/2015   Abnormal liver enzymes 07/27/2015   Back pain, thoracic 07/27/2015   Class 1 obesity due to excess calories with serious comorbidity and body mass index (BMI) of 31.0 to 31.9 in adult 07/27/2015   Benign neoplasm of colon 07/27/2015   Restless leg 07/27/2015   Gallstone 10/12/2012     Past Surgical History:  Procedure Laterality Date   CHOLECYSTECTOMY  2013   COLONOSCOPY  01/08/11   COLONOSCOPY WITH PROPOFOL N/A 12/12/2015   Procedure: COLONOSCOPY WITH PROPOFOL;  Surgeon: Robert Bellow, MD;  Location: [facility] ENDOSCOPY;  Service: Endoscopy;  Laterality: N/A;   COLONOSCOPY WITH PROPOFOL N/A 12/12/2020   Procedure: COLONOSCOPY WITH PROPOFOL;  Surgeon: Jonathon Bellows, MD;  Location: [facility] ENDOSCOPY;  Service: Gastroenterology;  Laterality: N/A;   CYST REMOVAL LEG Left 2010   KNEE SURGERY Bilateral 2000's   SHOULDER SURGERY Right 2010   SYNOVECTOMY     due to complex medical bilateral meniscus tear   TONSILLECTOMY  kid     Prior to Admission medications   Not on File     Allergies Bee venom   Family History  Problem Relation Age of Onset   Alzheimer's disease Mother    Heart  attack Father    Obesity Father    Hepatitis Sister    Healthy Son    Healthy Son    Healthy Daughter    Colon cancer Neg Hx    Prostate cancer Neg Hx     Social History Social History   Tobacco Use   Smoking status: Never   Smokeless tobacco: Never  Vaping Use   Vaping Use: Never used  Substance Use Topics   Alcohol use: Yes    Comment: occasional   Drug use: No    Review of Systems  Constitutional:   No fever or chills.  ENT:   No sore throat. No rhinorrhea. Cardiovascular:   No chest pain or syncope. Respiratory:   No dyspnea or cough. Gastrointestinal:   Negative for abdominal pain, vomiting and diarrhea.  Musculoskeletal:   Negative for focal pain or swelling All other systems reviewed and are negative except as documented above in ROS and HPI.  ____________________________________________   PHYSICAL EXAM:  VITAL SIGNS: ED Triage Vitals  Enc Vitals Group     BP 06/02/21 1619 (!) 155/95     Pulse Rate 06/02/21 1619 69     Resp 06/02/21 1619 20     Temp --      Temp src --      SpO2 06/02/21 1619 100 %     Weight 06/02/21 1638 221 lb (100.2 kg)     Height --      Head Circumference --  Peak Flow --      Pain Score --      Pain Loc --      Pain Edu? --      Excl. in [place]? --     Vital signs reviewed, nursing assessments reviewed.   Constitutional:   Alert and oriented. Non-toxic appearance. Eyes:   Conjunctivae are normal. EOMI. PERRL. ENT      Head:   Normocephalic and atraumatic.      Nose:   Wearing a mask.      Mouth/Throat:   Wearing a mask.      Neck:   No meningismus. Full ROM. Hematological/Lymphatic/Immunilogical:   No cervical lymphadenopathy. Cardiovascular:   RRR. Symmetric bilateral radial and DP pulses.  No murmurs. Cap refill less than 2 seconds. Respiratory:   Normal respiratory effort without tachypnea/retractions. Breath sounds are clear and equal bilaterally. No wheezes/rales/rhonchi. Gastrointestinal:   Soft and nontender.  Non distended. There is no CVA tenderness.  No rebound, rigidity, or guarding. Genitourinary:   deferred Musculoskeletal:   Normal range of motion in all extremities. No joint effusions.  No lower extremity tenderness.  No edema. Neurologic:   Normal speech and language.  CN 3-12 intact Motor grossly intact. Normal balance and coordination. No pronator drift There is paresthesia of LUE and LLE NIH SS = 2 Skin:    Skin is warm, dry and intact. No rash noted.  No petechiae, purpura, or bullae.  ____________________________________________    LABS (pertinent positives/negatives) (all labs ordered are listed, but only abnormal results are displayed) Labs Reviewed  APTT - Abnormal; Notable for the following components:      Result Value   aPTT 38 (*)    All other components within normal limits  COMPREHENSIVE METABOLIC PANEL - Abnormal; Notable for the following components:   Calcium 8.4 (*)    All other components within normal limits  PROTIME-INR  CBC  DIFFERENTIAL  CBG MONITORING, ED   ____________________________________________   EKG  Interpreted by me Nsr rate 65. Nl axis, intervals, QRS, ST segments, and T waves  ____________________________________________    RADIOLOGY  CT HEAD CODE STROKE WO CONTRAST  Result Date: 06/02/2021 CLINICAL DATA:  Left-sided deficit EXAM: CT HEAD WITHOUT CONTRAST TECHNIQUE: Contiguous axial images were obtained from the base of the skull through the vertex without intravenous contrast. COMPARISON:  None. FINDINGS: Brain: There is no acute intracranial hemorrhage, mass effect, or edema. Gray-white differentiation is preserved. Ventricles and sulci are within normal limits in size and configuration. Minimal patchy low-density in the right frontal subcortical white matter may reflect chronic microvascular ischemic changes. No extra-axial collection. Vascular: No hyperdense vessel. Skull: Unremarkable. Sinuses/Orbits: No significant  opacification. No acute abnormality of the orbits. Other: Mastoid air cells are clear. ASPECTS ([place] Stroke Program Early CT Score) - Ganglionic level infarction (caudate, lentiform nuclei, internal capsule, insula, M1-M3 cortex): 7 - Supraganglionic infarction (M4-M6 cortex): 3 Total score (0-10 with 10 being normal): 10 IMPRESSION: There is no acute intracranial hemorrhage or evidence of acute infarction. ASPECT score is 10. Electronically Signed   By: Macy Mis M.D.   On: 06/02/2021 16:40    ____________________________________________   PROCEDURES Procedures  ____________________________________________  DIFFERENTIAL DIAGNOSIS   ICH, stroke, electrolyte abnormality, dehydration  CLINICAL IMPRESSION / ASSESSMENT AND PLAN / ED COURSE  Medications ordered in the ED: Medications  clopidogrel (PLAVIX) tablet 75 mg (has no administration in time range)  aspirin EC tablet 81 mg (81 mg Oral Given 06/02/21 1735)  sodium  chloride flush (NS) 0.9 % injection 3 mL (3 mLs Intravenous Given 06/02/21 1700)  clopidogrel (PLAVIX) tablet 300 mg (300 mg Oral Given 06/02/21 1735)  iohexol (OMNIPAQUE) 350 MG/ML injection 75 mL (75 mLs Intravenous Contrast Given 06/02/21 1750)    Pertinent labs & imaging results that were available during my care of the patient were reviewed by me and considered in my medical decision making (see chart for details).  Albert Carpenter was evaluated in Emergency Department on 06/02/2021 for the symptoms described in the history of present illness. He was evaluated in the context of the global COVID-19 pandemic, which necessitated consideration that the patient might be at risk for infection with the SARS-CoV-2 virus that causes COVID-19. Institutional protocols and algorithms that pertain to the evaluation of patients at risk for COVID-19 are in a state of rapid change based on information released by regulatory bodies including the CDC and federal and state  organizations. These policies and algorithms were followed during the patient's care in the ED.   Pt p/w suddent onset L side paresthesia. Code stroke initiated. CT head unremarkable. Labs unremarkable. Mental status normal. D/w Neuro Dr. Leonel Ramsay after his eval, recommends hospitalization for further TIA workup.      ____________________________________________   FINAL CLINICAL IMPRESSION(S) / ED DIAGNOSES    Final diagnoses:  TIA (transient ischemic attack)     ED Discharge Orders     None       Portions of this note were generated with dragon dictation software. Dictation errors may occur despite best attempts at proofreading.    Carrie Mew, MD 06/02/21 [phone]

## 2021-06-02 NOTE — Significant Event (Signed)
Patient wants to change code status to no intubation but rest ok.  Albert Carpenter

## 2021-06-03 ENCOUNTER — Other Ambulatory Visit: Payer: Self-pay

## 2021-06-03 ENCOUNTER — Encounter: Payer: Self-pay | Admitting: Cardiology

## 2021-06-03 ENCOUNTER — Observation Stay (HOSPITAL_BASED_OUTPATIENT_CLINIC_OR_DEPARTMENT_OTHER)
Admit: 2021-06-03 | Discharge: 2021-06-03 | Disposition: A | Discharge status: Home / Self Care | Payer: BC Managed Care – PPO | Attending: Family Medicine | Admitting: Family Medicine

## 2021-06-03 DIAGNOSIS — G459 Transient cerebral ischemic attack, unspecified: Secondary | ICD-10-CM

## 2021-06-03 LAB — ECHOCARDIOGRAM COMPLETE
AR max vel: 2.5 cm2
AV Area VTI: 2.45 cm2
AV Area mean vel: 2.27 cm2
AV Mean grad: 2 mmHg
AV Peak grad: 3.3 mmHg
Ao pk vel: 0.9 m/s
Area-P 1/2: 2.77 cm2
Height: 71 in
S' Lateral: 3.8 cm
Weight: 3597.91 oz

## 2021-06-03 LAB — LIPID PANEL
Cholesterol: 190 mg/dL (ref 0–200)
HDL: 49 mg/dL (ref >40)
LDL Cholesterol: 119 mg/dL — ABNORMAL HIGH (ref 0–99)
Total CHOL/HDL Ratio: 3.9 RATIO
Triglycerides: 112 mg/dL (ref <150)
VLDL: 22 mg/dL (ref 0–40)

## 2021-06-03 MED ORDER — CLOPIDOGREL BISULFATE 75 MG PO TABS
75.0000 mg | ORAL_TABLET | Freq: Every day | ORAL | 1 refills | Status: AC
Start: 2021-06-04 — End: 2021-08-03

## 2021-06-03 MED ORDER — ATORVASTATIN CALCIUM 40 MG PO TABS: 40.0000 mg | ORAL_TABLET | Freq: Every day | ORAL | 1 refills | Status: DC

## 2021-06-03 MED ORDER — ATORVASTATIN CALCIUM 20 MG PO TABS: 40.0000 mg | ORAL_TABLET | Freq: Every day | ORAL | Status: DC

## 2021-06-03 MED ORDER — ASPIRIN 81 MG PO TBEC: 81.0000 mg | DELAYED_RELEASE_TABLET | Freq: Every day | ORAL | 0 refills | Status: AC

## 2021-06-03 NOTE — Progress Notes (Signed)
Subjective: Feels almost completely back to normal, still some mild angle of the mouth numbness  Exam: Vitals:   06/03/21 0447 06/03/21 0751  BP: 109/77 122/81  Pulse: (!) 58 (!) 56  Resp: 16 16  Temp: 98.4 F (36.9 C) 97.7 F (36.5 C)  SpO2: 97% 98%   Gen: In bed, NAD Resp: non-labored breathing, no acute distress Abd: soft, nt  Neuro: MS: Awake, alert interactive and appropriate CN:?  Very mild left tongue deviation, face symmetric Motor: 5/5 throughout Sensory: Intact to light touch  Pertinent Labs: LDL 118    Impression: 60 year old male with transient left-sided numbness which I suspect is "mostly" TIA.  Given that he still has some very small numbness next to his mouth, it is possible he had some degree of injury, but this is not apparent on MRI and I would not expect it to be given how small it is.  I would favor treating this as TIA, including high intensity statin therapy with atorvastatin 40 mg daily as well as dual antiplatelet therapy for 3 weeks followed by Plavix monotherapy  Recommendations: 1) aspirin 81 mg and Plavix 75 mg daily for 3 weeks followed by Plavix alone 2) atorvastatin 40 mg daily 3) follow-up echo results, if negative then he could be discharged for outpatient follow-up from my perspective.  Roland Rack, MD [facility] [phone]  If 7pm- 7am, please page neurology on call as listed in [place].

## 2021-06-03 NOTE — Progress Notes (Addendum)
SLP Cancellation Note  Patient Details Name: Albert Carpenter MRN: [id number] DOB: 1961-04-24   Cancelled treatment:       Reason Eval/Treat Not Completed: SLP screened, no needs identified, will sign off. SLP consult received and appreciated. Chart review completed. Per chart review, pt admitted with TIA with c/o numbness/tingling on L side of abdomen, L arm, L neck, and L lower 2/3 of face. MRI Brain 06/02/21 "No acute infarction, hemorrhage, or mass." Pt reports a complete resolution of s/sx and denies changes to speech/language/cognition or swallowing. Per screening, pt A&Ox4. Pt's speech is fluent, appropriate, and without s/sx dysarthria or anomia. SLP to sign off at this time.   [name], M.S., [facility] [phone] [name])  [name] 06/03/2021, 9:04 AM

## 2021-06-03 NOTE — Plan of Care (Signed)

## 2021-06-03 NOTE — Progress Notes (Signed)
OT Cancellation Note  Patient Details Name: Albert Carpenter MRN: [id number] DOB: [birth year]   Cancelled Treatment:    Reason Eval/Treat Not Completed: OT screened, no needs identified, will sign off. Order received and chart review. Per chart review, pt back to baseline functional independence. No skilled OT needs identified. Will sign off. Please re-consult if additional needs arise.    Fredirick Maudlin, OTR/L [place]

## 2021-06-03 NOTE — Discharge Summary (Signed)
Physician Discharge Summary  Albert Carpenter TTS:[id number] DOB: 1960/07/20 DOA: 06/02/2021  PCP: Virginia Crews, MD  Admit date: 06/02/2021 Discharge date: 06/03/2021  Admitted From: Home Disposition: Home  Recommendations for Outpatient Follow-up:  Follow up with PCP in 1-2 weeks Establish care with [facility] neurology Follow-up outpatient with [facility] cardiology  Home Health: No Equipment/Devices: None  Discharge Condition: Stable CODE STATUS: Full Diet recommendation: Heart healthy  Brief/Interim Summary:  60 y.o. male with history of mild aortic root dilation, gallstones, diverticulosis, who presents with left-sided numbness.   Patient reports he was at home watching a world cup match earlier today when he suddenly developed an acute and noticeable numbness and tingling over the left side of his abdomen.  This subsequently spread up to include his left arm, left shoulder, left neck, and left face.  This occurred around 3:45 PM.  He waited a while and when his wife got home he told her about his symptoms and came to the hospital.  Patient denies any other symptoms, including chest pain, palpitations, nausea, diaphoresis, weakness, vision changes, or difficulty walking.  He is a never smoker.  He does not take any medications.  His father passed away from a stroke, no other family history of heart disease or stroke.   At the time of my interview around 6:15 PM patient reported that over the prior 15 minutes his symptoms had started to improve significantly.  Continue to improve symptomatically while in house.  When evaluated on the day of discharge patient did note some residual numbness of the left side of his mouth.  As such will treat as likely TIA.  Possible that patient had an event that is not radiographically evident.  As such at time of discharge will recommend dual antiplatelet therapy aspirin and Plavix x3 weeks, followed by Plavix monotherapy.  Also high intensity  statin.  These been prescribed on discharge.  Patient will follow-up with [facility] neurology and his PCP.  He has an established relationship with [facility] cardiology.   Discharge Diagnoses:  Principal Problem:   TIA (transient ischemic attack)  #Acute left-sided paresthesias Concerning for TIA versus stroke, appreciate neurology input, work-up in process. Residual left-sided facial numbness Possible small infarct not visible on MRI Appreciate neurology recommendations CTA negative Plan: Discharge home.  Will recommend dual antiplatelet therapy aspirin and Plavix x3 weeks, followed by Plavix monotherapy alone.  High intensity statin.  Follow-up outpatient PCP.  Refer to [facility] neurology for outpatient neurologic follow-up.   Discharge Instructions   Allergies as of 06/03/2021       Reactions   Bee Venom Swelling        Medication List     TAKE these medications    aspirin 81 MG EC tablet Take 1 tablet (81 mg total) by mouth daily for 21 days. Swallow whole. Start taking on: June 04, 2021   atorvastatin 40 MG tablet Commonly known as: LIPITOR Take 1 tablet (40 mg total) by mouth at bedtime.   clopidogrel 75 MG tablet Commonly known as: PLAVIX Take 1 tablet (75 mg total) by mouth daily. Start taking on: June 04, 2021   timolol 0.5 % ophthalmic solution Commonly known as: TIMOPTIC Place 1 drop into both eyes 2 (two) times daily.        Allergies  Allergen Reactions   Bee Venom Swelling    Consultations: Neurology   Procedures/Studies: CT ANGIO HEAD NECK W WO CM  Result Date: 06/02/2021 CLINICAL DATA:  Stroke, follow-up EXAM: CT  ANGIOGRAPHY HEAD AND NECK TECHNIQUE: Multidetector CT imaging of the head and neck was performed using the standard protocol during bolus administration of intravenous contrast. Multiplanar CT image reconstructions and MIPs were obtained to evaluate the vascular anatomy. Carotid stenosis measurements (when applicable) are  obtained utilizing NASCET criteria, using the distal internal carotid diameter as the denominator. CONTRAST:  65mL OMNIPAQUE IOHEXOL 350 MG/ML SOLN COMPARISON:  None. FINDINGS: CTA NECK Aortic arch: Great vessel origins are patent. Right carotid system: Patent. There is probable small web at the ICA origin. No stenosis. Left carotid system: Patent.  No stenosis. Vertebral arteries: Patent and codominant.  No stenosis. Skeleton: Cervical spine degenerative changes, greatest at C5-C6 and C6-C7. Other neck: Unremarkable. Upper chest: Included upper lungs are clear. Review of the MIP images confirms the above findings CTA HEAD Anterior circulation: Intracranial internal carotid arteries are patent with minor plaque. Anterior and middle cerebral arteries are patent. Posterior circulation: Intracranial vertebral arteries are patent. Basilar artery is patent. Major cerebellar artery origins are patent. Posterior cerebral arteries are patent. Venous sinuses: Patent as allowed by contrast bolus timing. Review of the MIP images confirms the above findings IMPRESSION: No large vessel occlusion, hemodynamically significant stenosis, or evidence of dissection. Electronically Signed   By: Macy Mis M.D.   On: 06/02/2021 18:13   MR BRAIN WO CONTRAST  Result Date: 06/02/2021 CLINICAL DATA:  Neuro deficit, acute, stroke suspected EXAM: MRI HEAD WITHOUT CONTRAST TECHNIQUE: Multiplanar, multiecho pulse sequences of the brain and surrounding structures were obtained without intravenous contrast. COMPARISON:  None. FINDINGS: Brain: There is no acute infarction or intracranial hemorrhage. There is no intracranial mass, mass effect, or edema. There is no hydrocephalus or extra-axial fluid collection. Ventricles and sulci are normal in size and configuration. A few punctate foci of T2 hyperintensity in the supratentorial white matter are nonspecific but may reflect minor chronic microvascular ischemic changes or  gliosis/demyelination of other etiologies. Vascular: Major vessel flow voids at the skull base are preserved. Skull and upper cervical spine: Normal marrow signal is preserved. Sinuses/Orbits: Paranasal sinuses are aerated. Orbits are unremarkable. Other: Sella is unremarkable.  Mastoid air cells are clear. IMPRESSION: No acute infarction, hemorrhage, or mass. Electronically Signed   By: Macy Mis M.D.   On: 06/02/2021 18:55   CT HEAD CODE STROKE WO CONTRAST  Result Date: 06/02/2021 CLINICAL DATA:  Left-sided deficit EXAM: CT HEAD WITHOUT CONTRAST TECHNIQUE: Contiguous axial images were obtained from the base of the skull through the vertex without intravenous contrast. COMPARISON:  None. FINDINGS: Brain: There is no acute intracranial hemorrhage, mass effect, or edema. Gray-white differentiation is preserved. Ventricles and sulci are within normal limits in size and configuration. Minimal patchy low-density in the right frontal subcortical white matter may reflect chronic microvascular ischemic changes. No extra-axial collection. Vascular: No hyperdense vessel. Skull: Unremarkable. Sinuses/Orbits: No significant opacification. No acute abnormality of the orbits. Other: Mastoid air cells are clear. ASPECTS ([place] Stroke Program Early CT Score) - Ganglionic level infarction (caudate, lentiform nuclei, internal capsule, insula, M1-M3 cortex): 7 - Supraganglionic infarction (M4-M6 cortex): 3 Total score (0-10 with 10 being normal): 10 IMPRESSION: There is no acute intracranial hemorrhage or evidence of acute infarction. ASPECT score is 10. Electronically Signed   By: Macy Mis M.D.   On: 06/02/2021 16:40      Subjective: Patient seen and examined on the day of discharge.  Stable no distress.  Stable for discharge home.  Only complaint is slight residual numbness on left side of  mouth.  Discharge Exam: Vitals:   06/03/21 0751 06/03/21 1121  BP: 122/81 139/87  Pulse: (!) 56 62  Resp: 16 16   Temp: 97.7 F (36.5 C) 98 F (36.7 C)  SpO2: 98% 96%   Vitals:   06/02/21 2150 06/03/21 0447 06/03/21 0751 06/03/21 1121  BP: 128/88 109/77 122/81 139/87  Pulse: 62 (!) 58 (!) 56 62  Resp: 18 16 16 16   Temp: 98 F (36.7 C) 98.4 F (36.9 C) 97.7 F (36.5 C) 98 F (36.7 C)  TempSrc: Oral Oral Oral Oral  SpO2: 97% 97% 98% 96%  Weight: 102 kg     Height: 5\' 11"  (1.803 m)       General: Pt is alert, awake, not in acute distress Cardiovascular: RRR, S1/S2 +, no rubs, no gallops Respiratory: CTA bilaterally, no wheezing, no rhonchi Abdominal: Soft, NT, ND, bowel sounds + Extremities: no edema, no cyanosis    The results of significant diagnostics from this hospitalization (including imaging, microbiology, ancillary and laboratory) are listed below for reference.     Microbiology: Recent Results (from the past 240 hour(s))  Resp Panel by RT-PCR (Flu A&B, Covid) Nasopharyngeal Swab     Status: None   Collection Time: 06/02/21  7:03 PM   Specimen: Nasopharyngeal Swab; Nasopharyngeal(NP) swabs in vial transport medium  Result Value Ref Range Status   SARS Coronavirus 2 by RT PCR NEGATIVE NEGATIVE Final    Comment: (NOTE) SARS-CoV-2 target nucleic acids are NOT DETECTED.  The SARS-CoV-2 RNA is generally detectable in upper respiratory specimens during the acute phase of infection. The lowest concentration of SARS-CoV-2 viral copies this assay can detect is 138 copies/mL. A negative result does not preclude SARS-Cov-2 infection and should not be used as the sole basis for treatment or other patient management decisions. A negative result may occur with  improper specimen collection/handling, submission of specimen other than nasopharyngeal swab, presence of viral mutation(s) within the areas targeted by this assay, and inadequate number of viral copies(<138 copies/mL). A negative result must be combined with clinical observations, patient history, and  epidemiological information. The expected result is Negative.  Fact Sheet for Patients:  EntrepreneurPulse.com.au  Fact Sheet for Healthcare Providers:  IncredibleEmployment.be  This test is no t yet approved or cleared by the [place] FDA and  has been authorized for detection and/or diagnosis of SARS-CoV-2 by FDA under an Emergency Use Authorization (EUA). This EUA will remain  in effect (meaning this test can be used) for the duration of the COVID-19 declaration under Section 564(b)(1) of the Act, 21 U.S.C.section 360bbb-3(b)(1), unless the authorization is terminated  or revoked sooner.       Influenza A by PCR NEGATIVE NEGATIVE Final   Influenza B by PCR NEGATIVE NEGATIVE Final    Comment: (NOTE) The Xpert Xpress SARS-CoV-2/FLU/RSV plus assay is intended as an aid in the diagnosis of influenza from Nasopharyngeal swab specimens and should not be used as a sole basis for treatment. Nasal washings and aspirates are unacceptable for Xpert Xpress SARS-CoV-2/FLU/RSV testing.  Fact Sheet for Patients: EntrepreneurPulse.com.au  Fact Sheet for Healthcare Providers: IncredibleEmployment.be  This test is not yet approved or cleared by the [place] FDA and has been authorized for detection and/or diagnosis of SARS-CoV-2 by FDA under an Emergency Use Authorization (EUA). This EUA will remain in effect (meaning this test can be used) for the duration of the COVID-19 declaration under Section 564(b)(1) of the Act, 21 U.S.C. section 360bbb-3(b)(1), unless the authorization is terminated  or revoked.  Performed at [facility], [address] [place],  [address]      Labs: BNP (last 3 results) No results for input(s): BNP in the last 8760 hours. Basic Metabolic Panel: Recent Labs  Lab 06/02/21 1702  NA 137  K 3.8  CL 105  CO2 26  GLUCOSE 99  BUN 20  CREATININE 0.86   CALCIUM 8.4*   Liver Function Tests: Recent Labs  Lab 06/02/21 1702  AST 23  ALT 30  ALKPHOS 58  BILITOT 0.8  PROT 6.5  ALBUMIN 3.9   No results for input(s): LIPASE, AMYLASE in the last 168 hours. No results for input(s): AMMONIA in the last 168 hours. CBC: Recent Labs  Lab 06/02/21 1702  WBC 6.8  NEUTROABS 3.9  HGB 15.9  HCT 46.2  MCV 90.4  PLT 182   Cardiac Enzymes: No results for input(s): CKTOTAL, CKMB, CKMBINDEX, TROPONINI in the last 168 hours. BNP: Invalid input(s): POCBNP CBG: Recent Labs  Lab 06/02/21 1620  GLUCAP 71   D-Dimer No results for input(s): DDIMER in the last 72 hours. Hgb A1c No results for input(s): HGBA1C in the last 72 hours. Lipid Profile Recent Labs    06/03/21 0501  CHOL 190  HDL 49  LDLCALC 119*  TRIG 112  CHOLHDL 3.9   Thyroid function studies No results for input(s): TSH, T4TOTAL, T3FREE, THYROIDAB in the last 72 hours.  Invalid input(s): FREET3 Anemia work up No results for input(s): VITAMINB12, FOLATE, FERRITIN, TIBC, IRON, RETICCTPCT in the last 72 hours. Urinalysis No results found for: COLORURINE, APPEARANCEUR, [place], [place], GLUCOSEU, [place], [place], [place], PROTEINUR, UROBILINOGEN, NITRITE, LEUKOCYTESUR Sepsis Labs Invalid input(s): PROCALCITONIN,  WBC,  LACTICIDVEN Microbiology Recent Results (from the past 240 hour(s))  Resp Panel by RT-PCR (Flu A&B, Covid) Nasopharyngeal Swab     Status: None   Collection Time: 06/02/21  7:03 PM   Specimen: Nasopharyngeal Swab; Nasopharyngeal(NP) swabs in vial transport medium  Result Value Ref Range Status   SARS Coronavirus 2 by RT PCR NEGATIVE NEGATIVE Final    Comment: (NOTE) SARS-CoV-2 target nucleic acids are NOT DETECTED.  The SARS-CoV-2 RNA is generally detectable in upper respiratory specimens during the acute phase of infection. The lowest concentration of SARS-CoV-2 viral copies this assay can detect is 138 copies/mL. A negative result does not  preclude SARS-Cov-2 infection and should not be used as the sole basis for treatment or other patient management decisions. A negative result may occur with  improper specimen collection/handling, submission of specimen other than nasopharyngeal swab, presence of viral mutation(s) within the areas targeted by this assay, and inadequate number of viral copies(<138 copies/mL). A negative result must be combined with clinical observations, patient history, and epidemiological information. The expected result is Negative.  Fact Sheet for Patients:  EntrepreneurPulse.com.au  Fact Sheet for Healthcare Providers:  IncredibleEmployment.be  This test is no t yet approved or cleared by the [place] FDA and  has been authorized for detection and/or diagnosis of SARS-CoV-2 by FDA under an Emergency Use Authorization (EUA). This EUA will remain  in effect (meaning this test can be used) for the duration of the COVID-19 declaration under Section 564(b)(1) of the Act, 21 U.S.C.section 360bbb-3(b)(1), unless the authorization is terminated  or revoked sooner.       Influenza A by PCR NEGATIVE NEGATIVE Final   Influenza B by PCR NEGATIVE NEGATIVE Final    Comment: (NOTE) The Xpert Xpress SARS-CoV-2/FLU/RSV plus assay is intended as an aid in  the diagnosis of influenza from Nasopharyngeal swab specimens and should not be used as a sole basis for treatment. Nasal washings and aspirates are unacceptable for Xpert Xpress SARS-CoV-2/FLU/RSV testing.  Fact Sheet for Patients: EntrepreneurPulse.com.au  Fact Sheet for Healthcare Providers: IncredibleEmployment.be  This test is not yet approved or cleared by the [place] FDA and has been authorized for detection and/or diagnosis of SARS-CoV-2 by FDA under an Emergency Use Authorization (EUA). This EUA will remain in effect (meaning this test can be used) for the  duration of the COVID-19 declaration under Section 564(b)(1) of the Act, 21 U.S.C. section 360bbb-3(b)(1), unless the authorization is terminated or revoked.  Performed at [facility], [address] [place], [place] [address]      Time coordinating discharge: Over 30 minutes  SIGNED:   Sidney Ace, MD  [facility] 06/03/2021, 11:52 AM Pager   If 7PM-7AM, please contact night-coverage

## 2021-06-03 NOTE — Progress Notes (Signed)
PT Cancellation Note  Patient Details Name: Albert Carpenter MRN: [id number] DOB: 1961-05-18   Cancelled Treatment:    Reason Eval/Treat Not Completed: PT screened, no needs identified, will sign off (Consult received and chart reviewed. Noted work up for TIA/CVA; MRI negative for acute infarct.  Patient typing on home computer upon arrival to room.  Reports resolution of symptoms and return to baseline abilities; endorses in-room mobility without assist device, no difficulty, safety concern or change from baseline abilities noted.  Declines need for formal PT evaluation at this time.  Will complete order and sign off at this time; please re-consult should needs change.)  Libia Fazzini H. Owens Shark, PT, DPT, NCS 06/03/21, 9:51 AM [phone]

## 2021-06-03 NOTE — Progress Notes (Signed)
Patient is being discharged home to self care. IV has been removed. Discharge instructions have been given. Patient educated on new medications and MD instructions. Wife is present at bedside for transportation home.

## 2021-06-04 ENCOUNTER — Encounter: Payer: Self-pay | Admitting: Cardiology

## 2021-06-04 LAB — HEMOGLOBIN A1C
Hgb A1c MFr Bld: 5.5 % (ref 4.8–5.6)
Mean Plasma Glucose: 111 mg/dL

## 2021-06-11 ENCOUNTER — Other Ambulatory Visit: Payer: Self-pay

## 2021-06-11 ENCOUNTER — Ambulatory Visit: Payer: BC Managed Care – PPO | Admitting: Cardiology

## 2021-06-11 ENCOUNTER — Ambulatory Visit (INDEPENDENT_AMBULATORY_CARE_PROVIDER_SITE_OTHER): Payer: BC Managed Care – PPO

## 2021-06-11 ENCOUNTER — Encounter: Payer: Self-pay | Admitting: Cardiology

## 2021-06-11 VITALS — BP 120/80 | HR 59 | Ht 71.0 in | Wt 227.0 lb

## 2021-06-11 DIAGNOSIS — G459 Transient cerebral ischemic attack, unspecified: Secondary | ICD-10-CM

## 2021-06-11 DIAGNOSIS — E7800 Pure hypercholesterolemia, unspecified: Secondary | ICD-10-CM | POA: Diagnosis not present

## 2021-06-11 DIAGNOSIS — I77810 Thoracic aortic ectasia: Secondary | ICD-10-CM | POA: Diagnosis not present

## 2021-06-11 NOTE — Progress Notes (Signed)
Cardiology Office Note:    Date:  06/11/2021   ID:  Georgeanne Nim, DOB February 27, 1961, MRN [id number]  PCP:  Virginia Crews, MD   [place]  Cardiologist:  Kate Sable, MD  Advanced Practice Provider:  No care team member to display Electrophysiologist:  None       Referring MD: Virginia Crews, MD   Chief Complaint  Patient presents with   [facility] follow up -- Meds reviewed verbally with patient.      History of Present Illness:    TEION BALLIN is a 60 y.o. male with a hx of hyperlipidemia, mild aortic root dilatation who presents for follow-up.    Recently seen in the hospital due to left-sided numbness.  Head imaging with no acute infarct.  Diagnosed with TIA.  Managed with aspirin and Plavix x3 weeks followed by Plavix monotherapy.  Denies any further occurrence of numbness.  Denies palpitations, dizziness, syncope.  Prior notes Echo 05/2021 EF 60 to 65%, Coronary calcium 10/2020 calcium score 29, 50th percentile for age.   History reviewed. No pertinent past medical history.  Past Surgical History:  Procedure Laterality Date   CHOLECYSTECTOMY  2013   COLONOSCOPY  01/08/11   COLONOSCOPY WITH PROPOFOL N/A 12/12/2015   Procedure: COLONOSCOPY WITH PROPOFOL;  Surgeon: Robert Bellow, MD;  Location: [facility] ENDOSCOPY;  Service: Endoscopy;  Laterality: N/A;   COLONOSCOPY WITH PROPOFOL N/A 12/12/2020   Procedure: COLONOSCOPY WITH PROPOFOL;  Surgeon: Jonathon Bellows, MD;  Location: [facility] ENDOSCOPY;  Service: Gastroenterology;  Laterality: N/A;   CYST REMOVAL LEG Left 2010   KNEE SURGERY Bilateral 2000's   SHOULDER SURGERY Right 2010   SYNOVECTOMY     due to complex medical bilateral meniscus tear   TONSILLECTOMY  kid    Current Medications: Current Meds  Medication Sig   aspirin EC 81 MG EC tablet Take 1 tablet (81 mg total) by mouth daily for 21 days. Swallow whole.   atorvastatin (LIPITOR) 40 MG tablet Take 1  tablet (40 mg total) by mouth at bedtime.   clopidogrel (PLAVIX) 75 MG tablet Take 1 tablet (75 mg total) by mouth daily.   timolol (TIMOPTIC) 0.5 % ophthalmic solution Place 1 drop into both eyes 2 (two) times daily.     Allergies:   Bee venom   Social History   Socioeconomic History   Marital status: Married    Spouse name: Not on file   Number of children: Not on file   Years of education: Not on file   Highest education level: Not on file  Occupational History   Not on file  Tobacco Use   Smoking status: Never   Smokeless tobacco: Never  Vaping Use   Vaping Use: Never used  Substance and Sexual Activity   Alcohol use: Yes    Comment: occasional   Drug use: No   Sexual activity: Not on file  Other Topics Concern   Not on file  Social History Narrative   Not on file   Social Determinants of Health   Financial Resource Strain: Not on file  Food Insecurity: Not on file  Transportation Needs: Not on file  Physical Activity: Not on file  Stress: Not on file  Social Connections: Not on file     Family History: The patient's family history includes Alzheimer's disease in his mother; Healthy in his daughter, son, and son; Heart attack in his father; Hepatitis in his sister; Obesity in  his father. There is no history of Colon cancer or Prostate cancer.  ROS:   Please see the history of present illness.     All other systems reviewed and are negative.  EKGs/Labs/Other Studies Reviewed:    The following studies were reviewed today:   EKG:  EKG is ordered today.  EKG shows sinus bradycardia, possible old inferior infarct  Recent Labs: 09/28/2020: TSH 2.490 06/02/2021: ALT 30; BUN 20; Creatinine, Ser 0.86; Hemoglobin 15.9; Platelets 182; Potassium 3.8; Sodium 137  Recent Lipid Panel    Component Value Date/Time   CHOL 190 06/03/2021 0501   CHOL 198 09/28/2020 1006   TRIG 112 06/03/2021 0501   HDL 49 06/03/2021 0501   HDL 53 09/28/2020 1006   CHOLHDL 3.9  06/03/2021 0501   VLDL 22 06/03/2021 0501   LDLCALC 119 (H) 06/03/2021 0501   LDLCALC 124 (H) 09/28/2020 1006     Risk Assessment/Calculations:      Physical Exam:    VS:  BP 120/80 (BP Location: Left Arm, Patient Position: Sitting, Cuff Size: Normal)   Pulse (!) 59   Ht 5\' 11"  (1.803 m)   Wt 227 lb (103 kg)   SpO2 98%   BMI 31.66 kg/m     Wt Readings from Last 3 Encounters:  06/11/21 227 lb (103 kg)  06/02/21 224 lb 13.9 oz (102 kg)  12/12/20 216 lb (98 kg)     GEN:  Well nourished, well developed in no acute distress HEENT: Normal NECK: No JVD; No carotid bruits LYMPHATICS: No lymphadenopathy CARDIAC: RRR, no murmurs, rubs, gallops RESPIRATORY:  Clear to auscultation without rales, wheezing or rhonchi  ABDOMEN: Soft, non-tender, non-distended MUSCULOSKELETAL:  No edema; No deformity  SKIN: Warm and dry NEUROLOGIC:  Alert and oriented x 3 PSYCHIATRIC:  Normal affect   ASSESSMENT:    1. TIA (transient ischemic attack)   2. Pure hypercholesterolemia   3. Aortic root dilation (HCC)     PLAN:    In order of problems listed above:  Recent TIA, place cardiac monitor to evaluate any significant arrhythmias such as A. fib or atrial flutter.  Continue Plavix, Lipitor as prescribed.  Prior echo with preserved EF. Hyperlipidemia, agree with Lipitor 40 mg daily as above Borderline to mild aortic root dilatation, plan for serial monitoring with echocardiogram.  Follow-up after cardiac monitor    Medication Adjustments/Labs and Tests Ordered: Current medicines are reviewed at length with the patient today.  Concerns regarding medicines are outlined above.  Orders Placed This Encounter  Procedures   LONG TERM MONITOR (3-14 DAYS)   EKG 12-Lead    No orders of the defined types were placed in this encounter.   Patient Instructions  Medication Instructions:  Your physician recommends that you continue on your current medications as directed. Please refer to the  Current Medication list given to you today.   *If you need a refill on your cardiac medications before your next appointment, please call your pharmacy*   Lab Work: None ordered If you have labs (blood work) drawn today and your tests are completely normal, you will receive your results only by: [place] (if you have MyChart) OR A paper copy in the mail If you have any lab test that is abnormal or we need to change your treatment, we will call you to review the results.   Testing/Procedures:  Your physician has recommended that you wear a Zio monitor for 14 days. This monitor is a medical device that records the  heart's electrical activity. Doctors most often use these monitors to diagnose arrhythmias. Arrhythmias are problems with the speed or rhythm of the heartbeat. The monitor is a small device applied to your chest. You can wear one while you do your normal daily activities. While wearing this monitor if you have any symptoms to push the button and record what you felt. Once you have worn this monitor for the period of time provider prescribed (Usually 14 days), you will return the monitor device in the postage paid box. Once it is returned they will download the data collected and provide [place] with a report which the provider will then review and we will call you with those results. Important tips:  Avoid showering during the first 24 hours of wearing the monitor. Avoid excessive sweating to help maximize wear time. Do not submerge the device, no hot tubs, and no swimming pools. Keep any lotions or oils away from the patch. After 24 hours you may shower with the patch on. Take brief showers with your back facing the shower head.  Do not remove patch once it has been placed because that will interrupt data and decrease adhesive wear time. Push the button when you have any symptoms and write down what you were feeling. Once you have completed wearing your monitor, remove and place  into box which has postage paid and place in your outgoing mailbox.  If for some reason you have misplaced your box then call our office and we can provide another box and/or mail it off for you.        Follow-Up: At [facility], you and your health needs are our priority.  As part of our continuing mission to provide you with exceptional heart care, we have created designated Provider Care Teams.  These Care Teams include your primary Cardiologist (physician) and Advanced Practice Providers (APPs -  Physician Assistants and Nurse Practitioners) who all work together to provide you with the care you need, when you need it.  We recommend signing up for the patient portal called "MyChart".  Sign up information is provided on this After Visit Summary.  MyChart is used to connect with patients for Virtual Visits (Telemedicine).  Patients are able to view lab/test results, encounter notes, upcoming appointments, etc.  Non-urgent messages can be sent to your provider as well.   To learn more about what you can do with MyChart, go to [email or web address].    Your next appointment:   6-8 week(s)  The format for your next appointment:   In Person  Provider:   You may see Kate Sable, MD or one of the following Advanced Practice Providers on your designated Care Team:   Murray Hodgkins, NP Christell Faith, PA-C Cadence Kathlen Mody, PA-C :1}    Other Instructions N/A   Signed, Kate Sable, MD  06/11/2021 5:16 PM    [place]

## 2021-06-11 NOTE — Patient Instructions (Signed)
Medication Instructions:  Your physician recommends that you continue on your current medications as directed. Please refer to the Current Medication list given to you today.   *If you need a refill on your cardiac medications before your next appointment, please call your pharmacy*   Lab Work: None ordered If you have labs (blood work) drawn today and your tests are completely normal, you will receive your results only by: [place] (if you have MyChart) OR A paper copy in the mail If you have any lab test that is abnormal or we need to change your treatment, we will call you to review the results.   Testing/Procedures:  Your physician has recommended that you wear a Zio monitor for 14 days. This monitor is a medical device that records the heart's electrical activity. Doctors most often use these monitors to diagnose arrhythmias. Arrhythmias are problems with the speed or rhythm of the heartbeat. The monitor is a small device applied to your chest. You can wear one while you do your normal daily activities. While wearing this monitor if you have any symptoms to push the button and record what you felt. Once you have worn this monitor for the period of time provider prescribed (Usually 14 days), you will return the monitor device in the postage paid box. Once it is returned they will download the data collected and provide [place] with a report which the provider will then review and we will call you with those results. Important tips:  Avoid showering during the first 24 hours of wearing the monitor. Avoid excessive sweating to help maximize wear time. Do not submerge the device, no hot tubs, and no swimming pools. Keep any lotions or oils away from the patch. After 24 hours you may shower with the patch on. Take brief showers with your back facing the shower head.  Do not remove patch once it has been placed because that will interrupt data and decrease adhesive wear time. Push the button  when you have any symptoms and write down what you were feeling. Once you have completed wearing your monitor, remove and place into box which has postage paid and place in your outgoing mailbox.  If for some reason you have misplaced your box then call our office and we can provide another box and/or mail it off for you.        Follow-Up: At [facility], you and your health needs are our priority.  As part of our continuing mission to provide you with exceptional heart care, we have created designated Provider Care Teams.  These Care Teams include your primary Cardiologist (physician) and Advanced Practice Providers (APPs -  Physician Assistants and Nurse Practitioners) who all work together to provide you with the care you need, when you need it.  We recommend signing up for the patient portal called "MyChart".  Sign up information is provided on this After Visit Summary.  MyChart is used to connect with patients for Virtual Visits (Telemedicine).  Patients are able to view lab/test results, encounter notes, upcoming appointments, etc.  Non-urgent messages can be sent to your provider as well.   To learn more about what you can do with MyChart, go to [email or web address].    Your next appointment:   6-8 week(s)  The format for your next appointment:   In Person  Provider:   You may see Kate Sable, MD or one of the following Advanced Practice Providers on your designated Care Team:   Murray Hodgkins, NP  Christell Faith, PA-C Cadence Kathlen Mody, PA-C :1}    Other Instructions N/A

## 2021-06-14 DIAGNOSIS — G459 Transient cerebral ischemic attack, unspecified: Secondary | ICD-10-CM | POA: Diagnosis not present

## 2021-08-07 ENCOUNTER — Encounter: Payer: Self-pay | Admitting: Cardiology

## 2021-08-07 ENCOUNTER — Ambulatory Visit: Payer: BC Managed Care – PPO | Admitting: Cardiology

## 2021-08-07 ENCOUNTER — Other Ambulatory Visit: Payer: Self-pay

## 2021-08-07 VITALS — BP 120/72 | HR 61 | Ht 71.0 in | Wt 225.0 lb

## 2021-08-07 DIAGNOSIS — I77810 Thoracic aortic ectasia: Secondary | ICD-10-CM

## 2021-08-07 DIAGNOSIS — E7800 Pure hypercholesterolemia, unspecified: Secondary | ICD-10-CM

## 2021-08-07 DIAGNOSIS — G459 Transient cerebral ischemic attack, unspecified: Secondary | ICD-10-CM | POA: Diagnosis not present

## 2021-08-07 NOTE — Patient Instructions (Addendum)
Medication Instructions:   Your physician recommends that you continue on your current medications as directed. Please refer to the Current Medication list given to you today.  *If you need a refill on your cardiac medications before your next appointment, please call your pharmacy*   Lab Work:  Your physician recommends that you return for a FASTING lipid profile:  In 3 months  - You will need to be fasting. Please do not have anything to eat or drink after midnight the morning you have the lab work. You may only have water or black coffee with no cream or sugar.   We will call you closer to your appointment time and schedule you in our office for this lab draw.    Testing/Procedures: None ordered   Follow-Up: At [facility], you and your health needs are our priority.  As part of our continuing mission to provide you with exceptional heart care, we have created designated Provider Care Teams.  These Care Teams include your primary Cardiologist (physician) and Advanced Practice Providers (APPs -  Physician Assistants and Nurse Practitioners) who all work together to provide you with the care you need, when you need it.  We recommend signing up for the patient portal called "MyChart".  Sign up information is provided on this After Visit Summary.  MyChart is used to connect with patients for Virtual Visits (Telemedicine).  Patients are able to view lab/test results, encounter notes, upcoming appointments, etc.  Non-urgent messages can be sent to your provider as well.   To learn more about what you can do with MyChart, go to [email or web address].    Your next appointment:   1 year(s)  The format for your next appointment:   In Person  Provider:   You may see Kate Sable, MD or one of the following Advanced Practice Providers on your designated Care Team:   Murray Hodgkins, NP Christell Faith, PA-C Cadence Kathlen Mody, [place]    Other Instructions

## 2021-08-07 NOTE — Progress Notes (Signed)
Cardiology Office Note:    Date:  08/07/2021   ID:  Albert Carpenter, DOB [birth year], MRN [id number]  PCP:  Virginia Crews, MD   [place]  Cardiologist:  Kate Sable, MD  Advanced Practice Provider:  No care team member to display Electrophysiologist:  None       Referring MD: Virginia Crews, MD   Chief Complaint  Patient presents with   OTher    8 week follow up post ZIO - Patient c/o ringing in Left ear.  Meds reviewed verbally with patient.      History of Present Illness:    Albert Carpenter is a 61 y.o. male with a hx of hyperlipidemia, mild aortic root dilatation who presents for follow-up.    Admitted to the hospital 05/2021 due to left-sided numbness.  Head imaging with no acute infarct.  Diagnosed with TIA.  Managed with aspirin and Plavix x3 weeks .  Currently takes aspirin and Lipitor.  Denies any further occurrence of numbness.  Denies palpitations, dizziness, syncope.  Cardiac monitor was placed to evaluate any significant arrhythmias contributing to CVA such as A-fib or flutter.  Prior notes Echo 05/2021 EF 60 to 65%, Coronary calcium 10/2020 calcium score 29, 50th percentile for age.   History reviewed. No pertinent past medical history.  Past Surgical History:  Procedure Laterality Date   CHOLECYSTECTOMY  2013   COLONOSCOPY  01/08/11   COLONOSCOPY WITH PROPOFOL N/A 12/12/2015   Procedure: COLONOSCOPY WITH PROPOFOL;  Surgeon: Robert Bellow, MD;  Location: [facility] ENDOSCOPY;  Service: Endoscopy;  Laterality: N/A;   COLONOSCOPY WITH PROPOFOL N/A 12/12/2020   Procedure: COLONOSCOPY WITH PROPOFOL;  Surgeon: Jonathon Bellows, MD;  Location: [facility] ENDOSCOPY;  Service: Gastroenterology;  Laterality: N/A;   CYST REMOVAL LEG Left 2010   KNEE SURGERY Bilateral 2000's   SHOULDER SURGERY Right 2010   SYNOVECTOMY     due to complex medical bilateral meniscus tear   TONSILLECTOMY  kid    Current Medications: Current Meds   Medication Sig   aspirin 81 MG chewable tablet Chew 81 mg by mouth daily.   atorvastatin (LIPITOR) 40 MG tablet Take 1 tablet (40 mg total) by mouth at bedtime.   clopidogrel (PLAVIX) 75 MG tablet Take 75 mg by mouth daily.   timolol (TIMOPTIC) 0.5 % ophthalmic solution Place 1 drop into both eyes 2 (two) times daily.     Allergies:   Bee venom   Social History   Socioeconomic History   Marital status: Married    Spouse name: Not on file   Number of children: Not on file   Years of education: Not on file   Highest education level: Not on file  Occupational History   Not on file  Tobacco Use   Smoking status: Never   Smokeless tobacco: Never  Vaping Use   Vaping Use: Never used  Substance and Sexual Activity   Alcohol use: Yes    Comment: occasional   Drug use: No   Sexual activity: Not on file  Other Topics Concern   Not on file  Social History Narrative   Not on file   Social Determinants of Health   Financial Resource Strain: Not on file  Food Insecurity: Not on file  Transportation Needs: Not on file  Physical Activity: Not on file  Stress: Not on file  Social Connections: Not on file     Family History: The patient's family history includes Alzheimer's disease in his  mother; Healthy in his daughter, son, and son; Heart attack in his father; Hepatitis in his sister; Obesity in his father. There is no history of Colon cancer or Prostate cancer.  ROS:   Please see the history of present illness.     All other systems reviewed and are negative.  EKGs/Labs/Other Studies Reviewed:    The following studies were reviewed today:   EKG:  EKG is ordered today.  EKG shows sinus bradycardia, possible old inferior infarct  Recent Labs: 09/28/2020: TSH 2.490 06/02/2021: ALT 30; BUN 20; Creatinine, Ser 0.86; Hemoglobin 15.9; Platelets 182; Potassium 3.8; Sodium 137  Recent Lipid Panel    Component Value Date/Time   CHOL 190 06/03/2021 0501   CHOL 198 09/28/2020  1006   TRIG 112 06/03/2021 0501   HDL 49 06/03/2021 0501   HDL 53 09/28/2020 1006   CHOLHDL 3.9 06/03/2021 0501   VLDL 22 06/03/2021 0501   LDLCALC 119 (H) 06/03/2021 0501   LDLCALC 124 (H) 09/28/2020 1006     Risk Assessment/Calculations:      Physical Exam:    VS:  BP 120/72 (BP Location: Left Arm, Patient Position: Sitting, Cuff Size: Normal)    Pulse 61    Ht 5\' 11"  (1.803 m)    Wt 225 lb (102.1 kg)    SpO2 99%    BMI 31.38 kg/m     Wt Readings from Last 3 Encounters:  08/07/21 225 lb (102.1 kg)  06/11/21 227 lb (103 kg)  06/02/21 224 lb 13.9 oz (102 kg)     GEN:  Well nourished, well developed in no acute distress HEENT: Normal NECK: No JVD; No carotid bruits LYMPHATICS: No lymphadenopathy CARDIAC: RRR, no murmurs, rubs, gallops RESPIRATORY:  Clear to auscultation without rales, wheezing or rhonchi  ABDOMEN: Soft, non-tender, non-distended MUSCULOSKELETAL:  No edema; No deformity  SKIN: Warm and dry NEUROLOGIC:  Alert and oriented x 3 PSYCHIATRIC:  Normal affect   ASSESSMENT:    1. TIA (transient ischemic attack)   2. Pure hypercholesterolemia   3. Aortic root dilation (HCC)     PLAN:    In order of problems listed above:  Recent TIA, cardiac monitor with no evidence for A. fib or atrial flutter.  Plans to take plavix x2 more weeks, continue aspirin, Lipitor as prescribed.  Echocardiogram 05/2021 EF 60 to 65%, aortic root diameter 3.5. Hyperlipidemia,  cont Lipitor 40 mg daily. Check lipid panel in 3 months. Borderline to mild aortic root dilatation, 3.9 cm.  Plan for serial monitoring with echocardiogram.  Follow-up in 1 years.    Medication Adjustments/Labs and Tests Ordered: Current medicines are reviewed at length with the patient today.  Concerns regarding medicines are outlined above.  Orders Placed This Encounter  Procedures   Lipid panel    No orders of the defined types were placed in this encounter.    Patient Instructions  Medication  Instructions:   Your physician recommends that you continue on your current medications as directed. Please refer to the Current Medication list given to you today.  *If you need a refill on your cardiac medications before your next appointment, please call your pharmacy*   Lab Work:  Your physician recommends that you return for a FASTING lipid profile:  In 3 months  - You will need to be fasting. Please do not have anything to eat or drink after midnight the morning you have the lab work. You may only have water or black coffee with no cream or  sugar.   We will call you closer to your appointment time and schedule you in our office for this lab draw.    Testing/Procedures: None ordered   Follow-Up: At [facility], you and your health needs are our priority.  As part of our continuing mission to provide you with exceptional heart care, we have created designated Provider Care Teams.  These Care Teams include your primary Cardiologist (physician) and Advanced Practice Providers (APPs -  Physician Assistants and Nurse Practitioners) who all work together to provide you with the care you need, when you need it.  We recommend signing up for the patient portal called "MyChart".  Sign up information is provided on this After Visit Summary.  MyChart is used to connect with patients for Virtual Visits (Telemedicine).  Patients are able to view lab/test results, encounter notes, upcoming appointments, etc.  Non-urgent messages can be sent to your provider as well.   To learn more about what you can do with MyChart, go to [email or web address].    Your next appointment:   1 year(s)  The format for your next appointment:   In Person  Provider:   You may see Kate Sable, MD or one of the following Advanced Practice Providers on your designated Care Team:   Murray Hodgkins, NP Christell Faith, PA-C Cadence Kathlen Mody, [place]    Other Instructions     Signed, Kate Sable, MD   08/07/2021 10:35 AM    [place]

## 2021-09-07 ENCOUNTER — Encounter: Payer: Self-pay | Admitting: Cardiology

## 2021-09-11 ENCOUNTER — Encounter: Payer: Self-pay | Admitting: Family Medicine

## 2022-04-29 IMAGING — CT CT HEAD CODE STROKE
4 series · 17 of 47 positions shown, 19 images · non-contrast
Comparison: None.

CLINICAL DATA: Left-sided deficit

EXAM:
CT HEAD WITHOUT CONTRAST
TECHNIQUE: Contiguous axial images were obtained from the base of the skull
through the vertex without intravenous contrast.

[Series 3: head wo · axial · 0.46mm/px · z∈[-77,+53]mm · 7 of 36 slices shown, 9 images]
[im 5/36  brain]
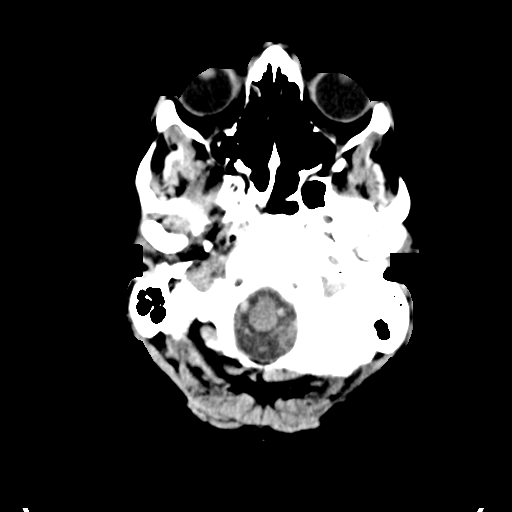
[im 5/36  bone]
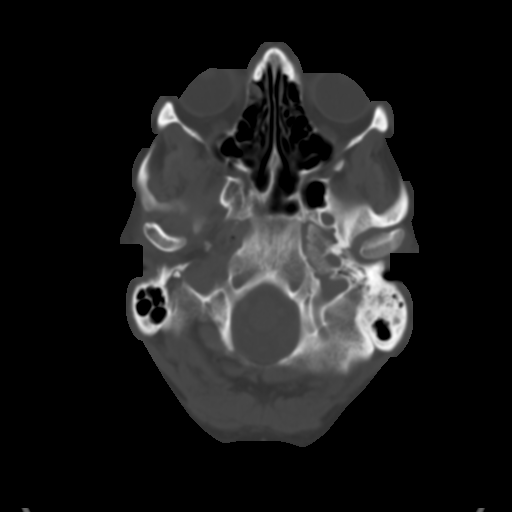
[im 9/36  brain]
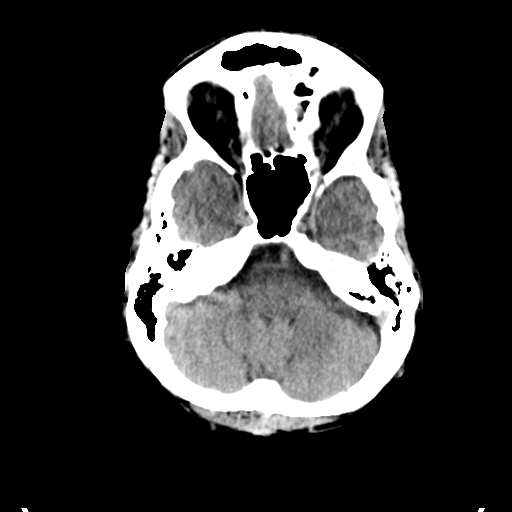
[im 14/36  brain]
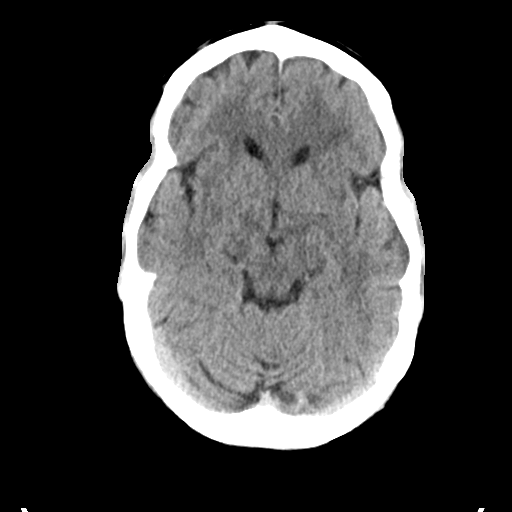
[im 18/36  brain]
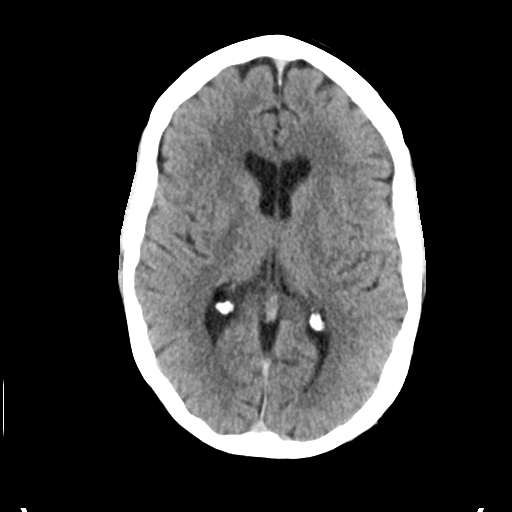
[im 22/36  brain]
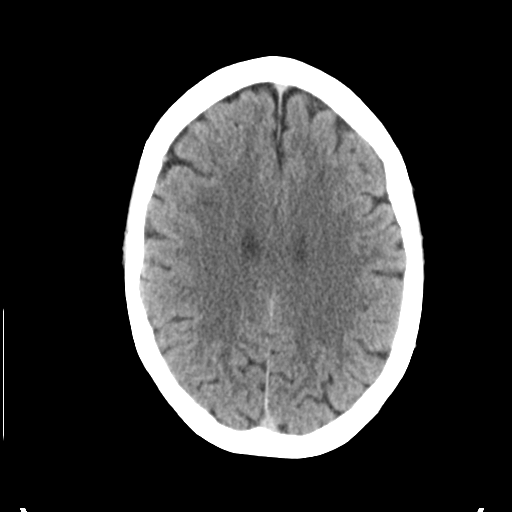
[im 22/36  bone]
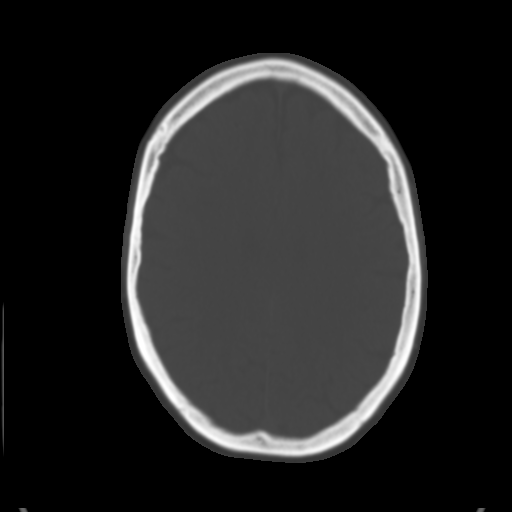
[im 27/36  brain]
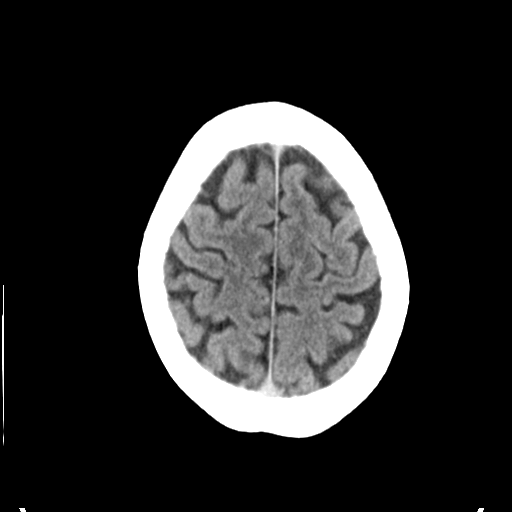
[im 31/36  brain]
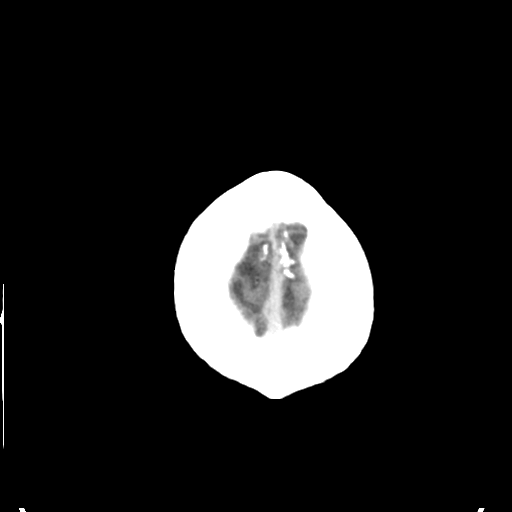

[Series 4: head bone · axial · 0.46mm/px · z∈[-81,-17]mm · 4 of 90 slices shown]
[im 9/90  bone]
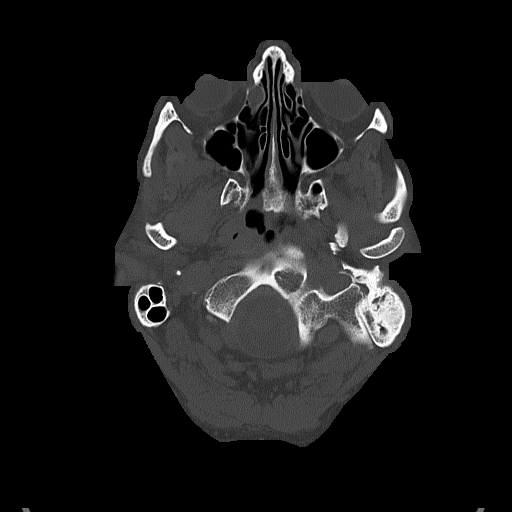
[im 18/90  bone]
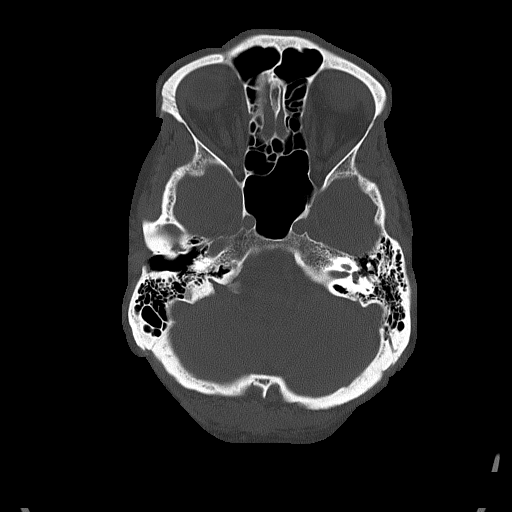
[im 27/90  bone]
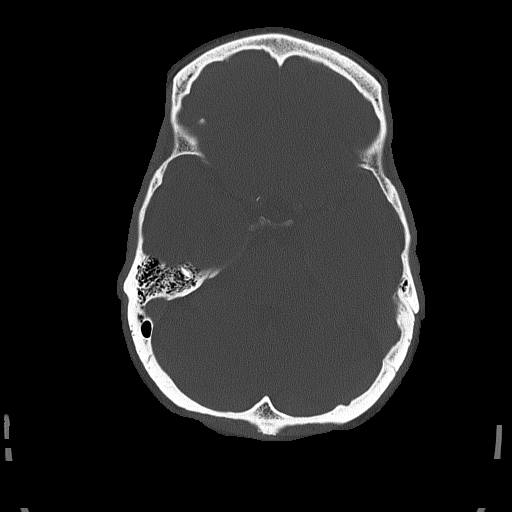
[im 41/90  bone]
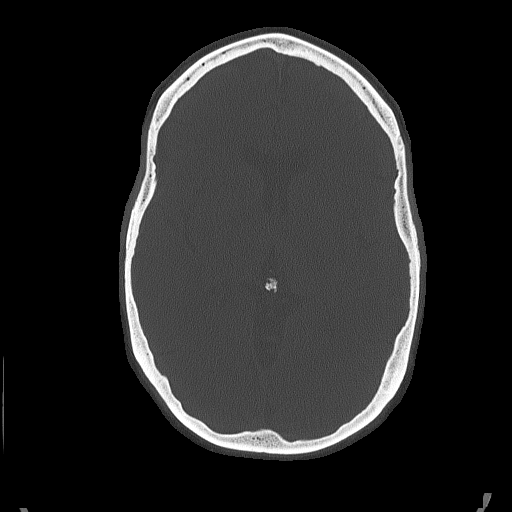

[Series 5: coronal soft tissue · coronal · 0.34mm/px · 3 of 70 slices shown]
[im 24/70  brain]
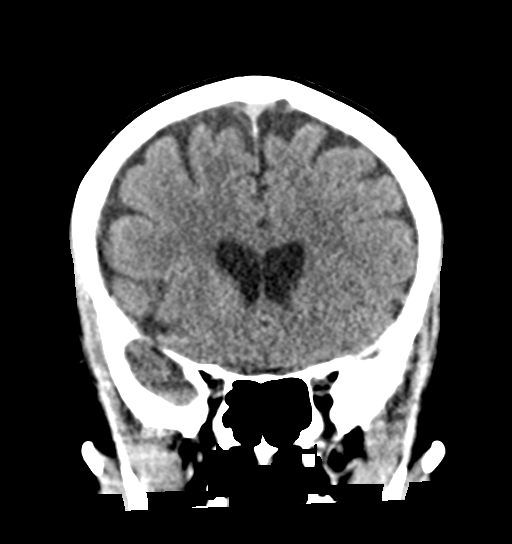
[im 31/70  brain]
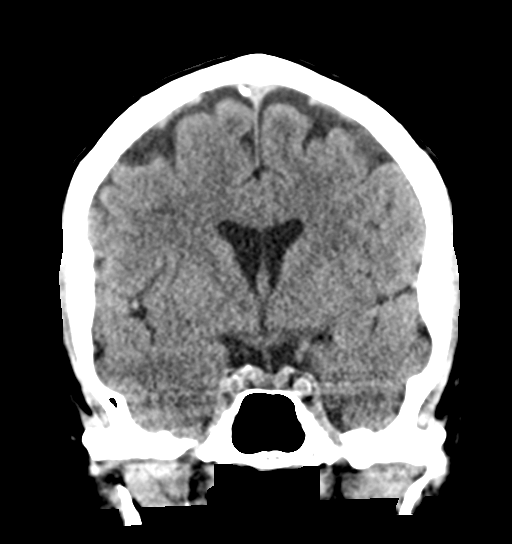
[im 39/70  brain]
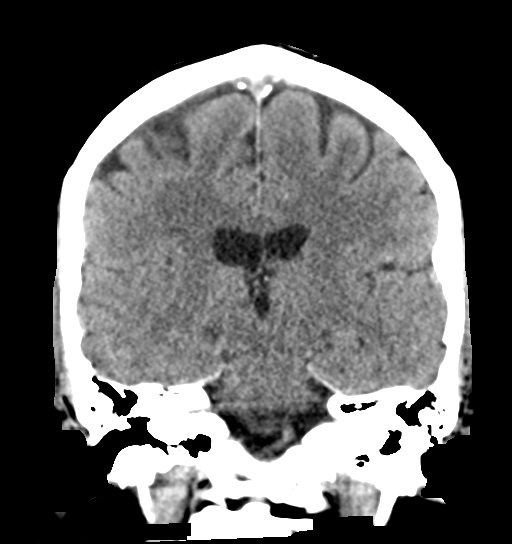

[Series 6: sagittal soft tissue · sagittal · 0.38mm/px · 3 of 59 slices shown]
[im 20/59  brain]
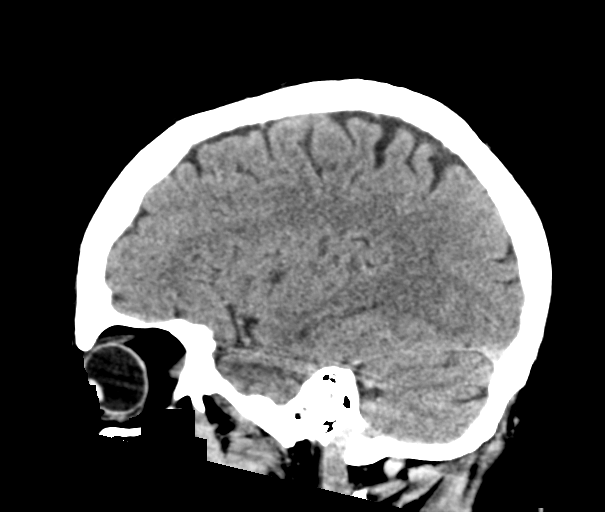
[im 30/59  brain]
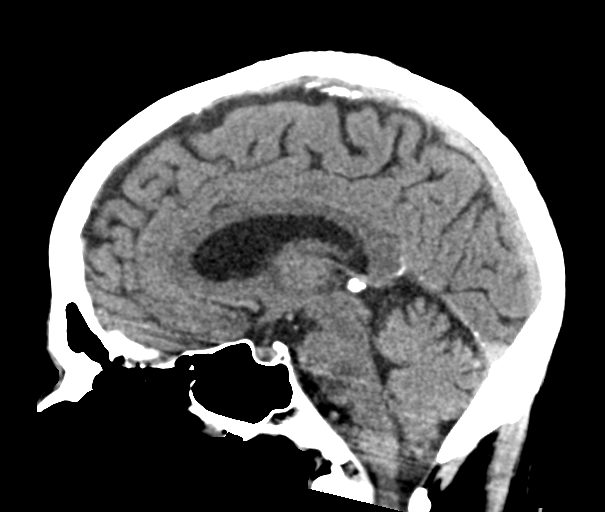
[im 39/59  brain]
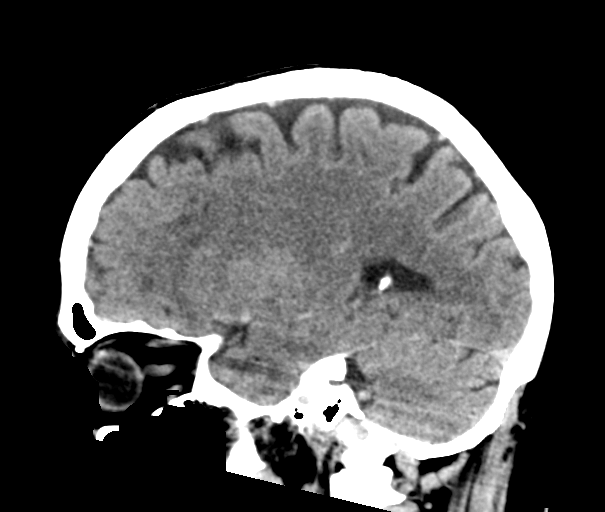

[17 of 47 positions shown; findings below may reference images not displayed]

FINDINGS: Brain: There is no acute intracranial hemorrhage, mass effect, or
edema. Gray-white differentiation is preserved. Ventricles and sulci
are within normal limits in size and configuration. Minimal patchy
low-density in the right frontal subcortical white matter may
reflect chronic microvascular ischemic changes. No extra-axial
collection.

Vascular: No hyperdense vessel.

Skull: Unremarkable.

Sinuses/Orbits: No significant opacification. No acute abnormality
of the orbits.

Other: Mastoid air cells are clear.

ASPECTS (Alberta Stroke Program Early CT Score)

- Ganglionic level infarction (caudate, lentiform nuclei, internal
capsule, insula, M1-M3 cortex): 7

- Supraganglionic infarction (M4-M6 cortex): 3

Total score (0-10 with 10 being normal): 10
IMPRESSION: There is no acute intracranial hemorrhage or evidence of acute
infarction. ASPECT score is 10.

## 2022-04-29 IMAGING — CT CT ANGIO HEAD-NECK (W OR W/O PERF)
3 of 7 series · 10 of 36 positions shown · IV contrast (omnipaque)
Comparison: None.

CLINICAL DATA: Stroke, follow-up

EXAM:
CT ANGIOGRAPHY HEAD AND NECK
TECHNIQUE: Multidetector CT imaging of the head and neck was performed using
the standard protocol during bolus administration of intravenous
contrast. Multiplanar CT image reconstructions and MIPs were
obtained to evaluate the vascular anatomy. Carotid stenosis
measurements (when applicable) are obtained utilizing NASCET
criteria, using the distal internal carotid diameter as the
denominator.
CONTRAST:  75mL OMNIPAQUE IOHEXOL 350 MG/ML SOLN

[Series 4: cta head neck · axial · 0.52mm/px · z∈[-248,-118]mm · 2 of 196 slices shown]
[im 66/196  soft-tissue]
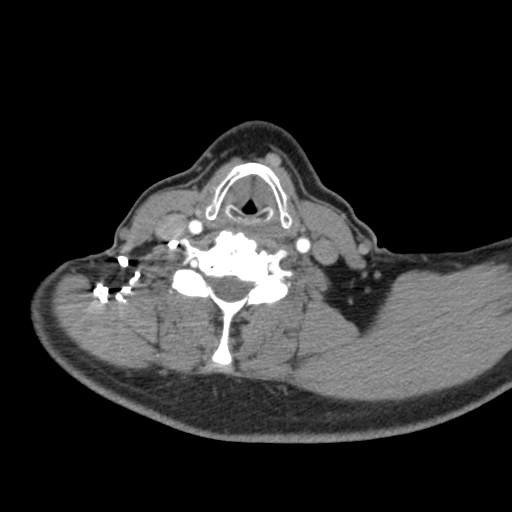
[im 131/196  soft-tissue]
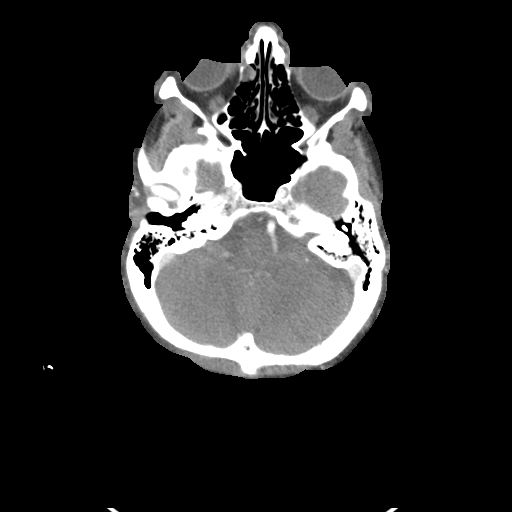

[Series 6: ax thin · axial · 0.54mm/px · z∈[-323,-41]mm · 6 of 397 slices shown]
[im 57/397  soft-tissue]
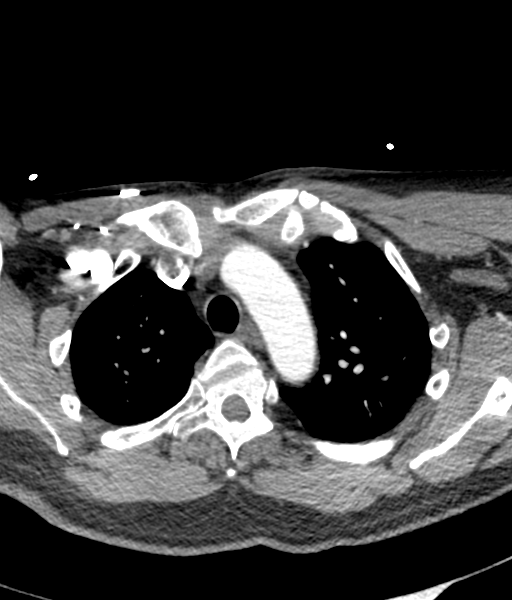
[im 114/397  bone]
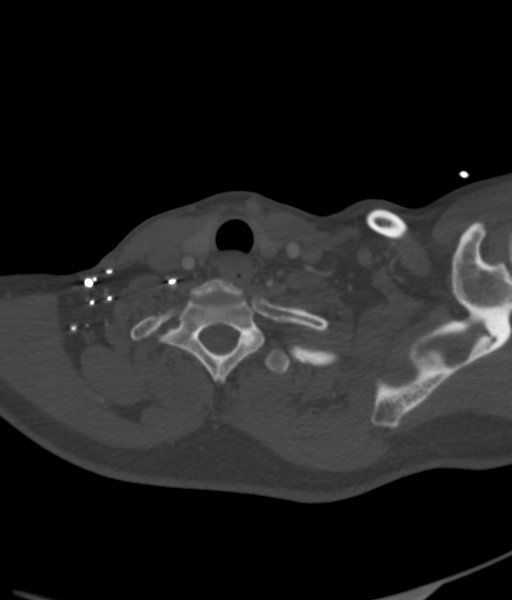
[im 170/397  soft-tissue]
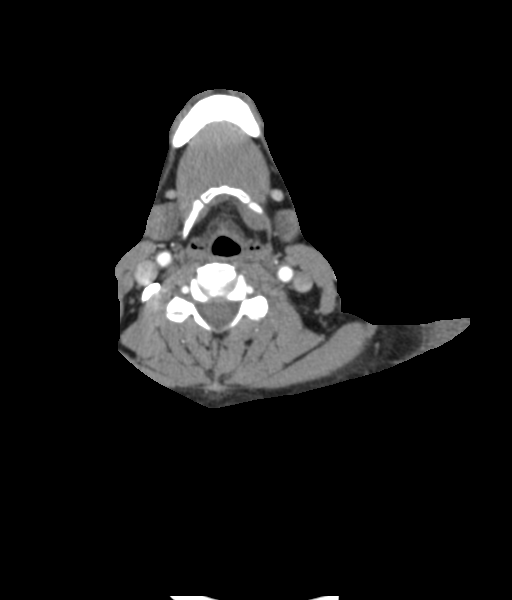
[im 227/397  bone]
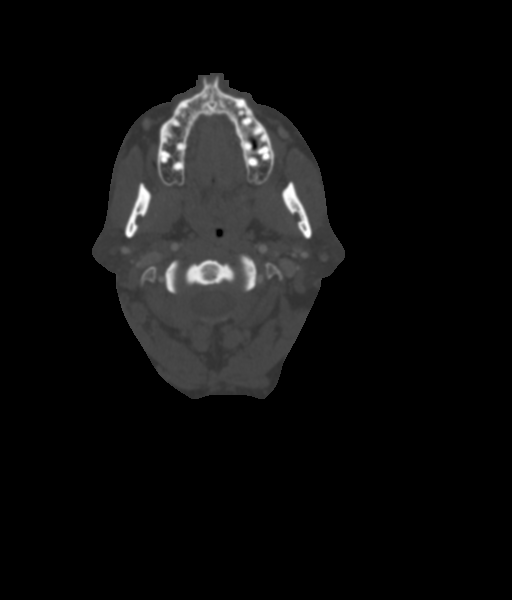
[im 283/397  soft-tissue]
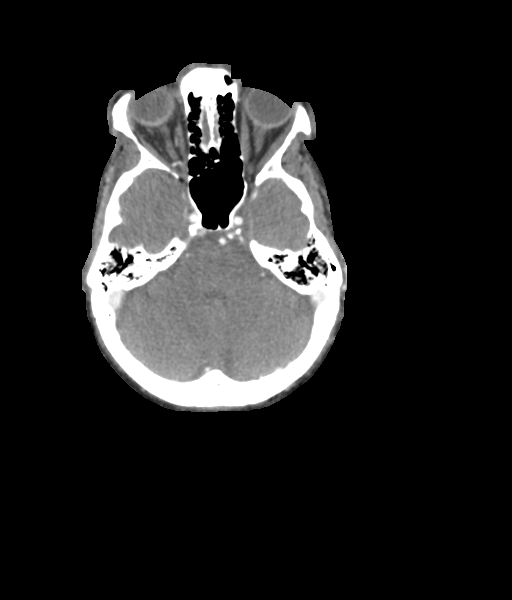
[im 340/397  bone]
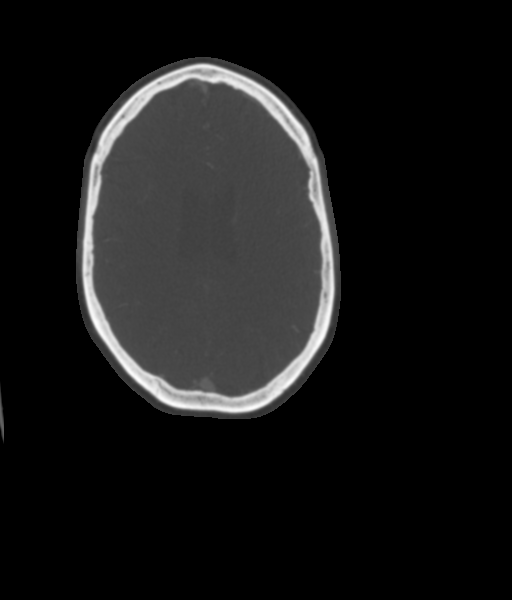

[Series 8: sagittal thin · sagittal · 0.52mm/px · 2 of 499 slices shown]
[im 135/499  soft-tissue]
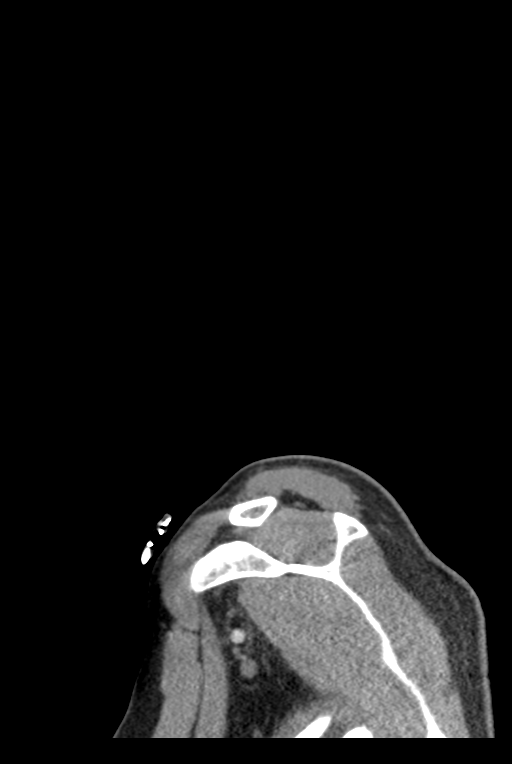
[im 242/499  soft-tissue]
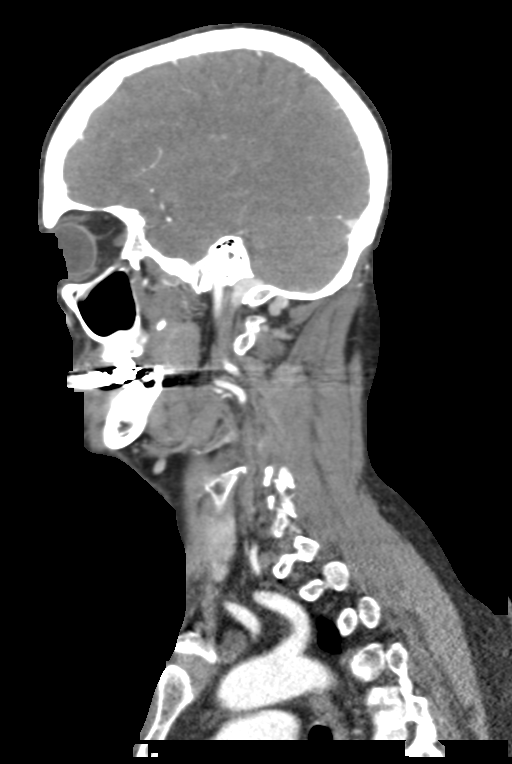

[10 of 36 positions shown; findings below may reference images not displayed]

FINDINGS: CTA NECK

Aortic arch: Great vessel origins are patent.

Right carotid system: Patent. There is probable small web at the ICA
origin. No stenosis.

Left carotid system: Patent.  No stenosis.

Vertebral arteries: Patent and codominant.  No stenosis.

Skeleton: Cervical spine degenerative changes, greatest at C5-C6 and
C6-C7.

Other neck: Unremarkable.

Upper chest: Included upper lungs are clear.

Review of the MIP images confirms the above findings

CTA HEAD

Anterior circulation: Intracranial internal carotid arteries are
patent with minor plaque. Anterior and middle cerebral arteries are
patent.

Posterior circulation: Intracranial vertebral arteries are patent.
Basilar artery is patent. Major cerebellar artery origins are
patent. Posterior cerebral arteries are patent.

Venous sinuses: Patent as allowed by contrast bolus timing.

Review of the MIP images confirms the above findings
IMPRESSION: No large vessel occlusion, hemodynamically significant stenosis, or
evidence of dissection.

## 2022-04-29 IMAGING — MR MR HEAD W/O CM
11 series · 48 of 48 positions shown · non-contrast
Comparison: None.

CLINICAL DATA: Neuro deficit, acute, stroke suspected

EXAM:
MRI HEAD WITHOUT CONTRAST
TECHNIQUE: Multiplanar, multiecho pulse sequences of the brain and surrounding
structures were obtained without intravenous contrast.

[Series 5: ax dwi_tracew · axial · 3.0mm · 0.71mm/px · z∈[-80,+84]mm · 5 of 56 slices shown]
[im 1/56]
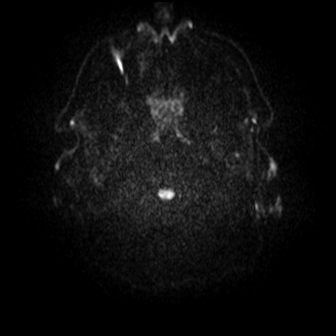
[im 14/56]
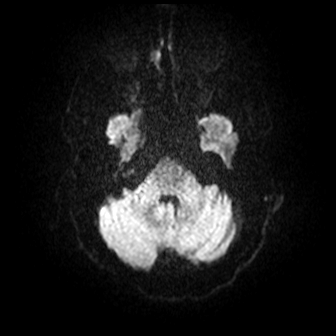
[im 28/56]
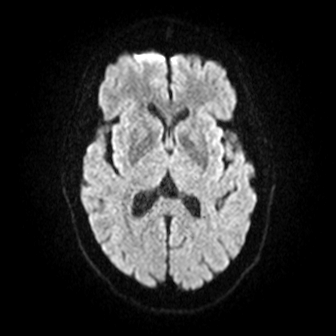
[im 42/56]
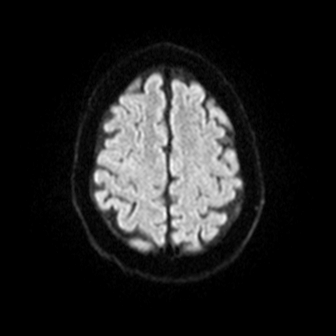
[im 56/56]
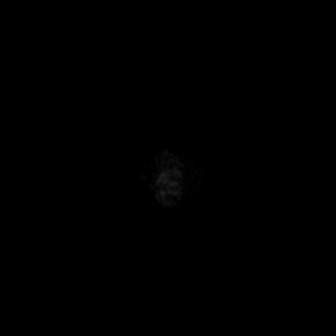

[Series 6: ax dwi_adc · axial · 3.0mm · 0.71mm/px · z∈[-80,+84]mm · 5 of 56 slices shown]
[im 1/56]
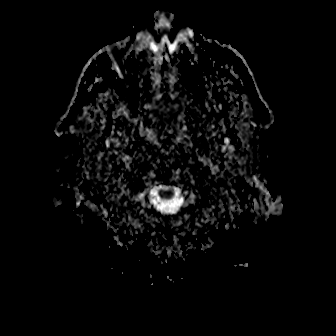
[im 14/56]
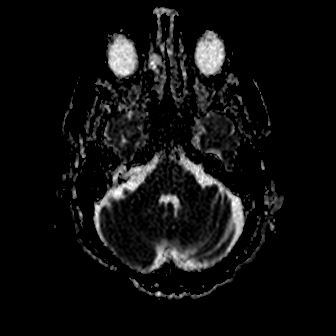
[im 28/56]
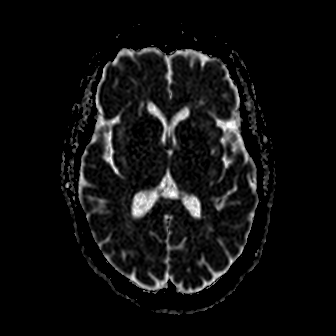
[im 42/56]
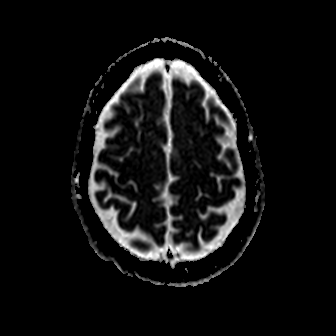
[im 56/56]
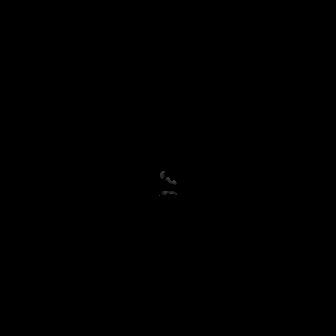

[Series 7: cor dwi_tracew · coronal · 5.0mm · 0.68mm/px · 4 of 40 slices shown]
[im 1/40]
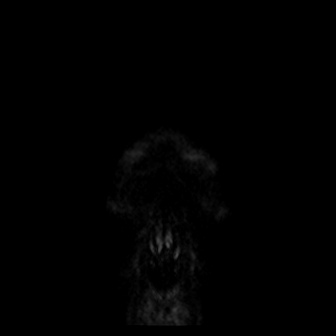
[im 14/40]
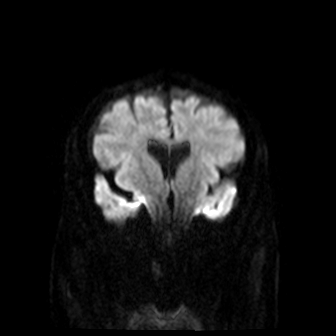
[im 27/40]
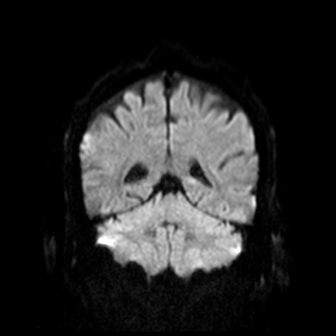
[im 40/40]
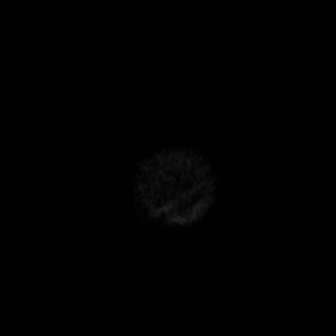

[Series 8: cor dwi_adc · coronal · 5.0mm · 0.68mm/px · 3 of 40 slices shown]
[im 1/40]
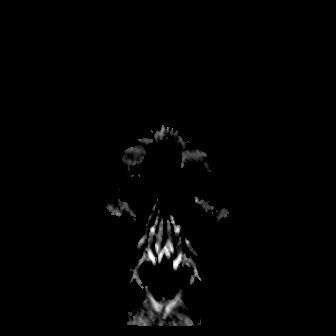
[im 20/40]
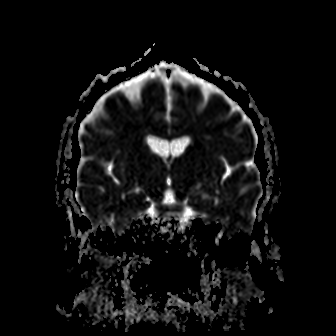
[im 40/40]
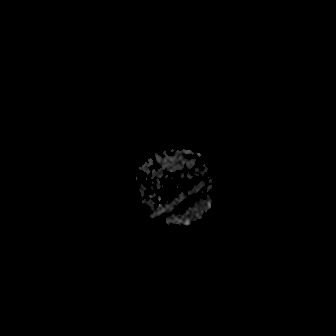

[Series 9: T1 · sagittal · 5.0mm · 0.47mm/px · 2 of 22 slices shown (1 of 2)]
[im 1/22]
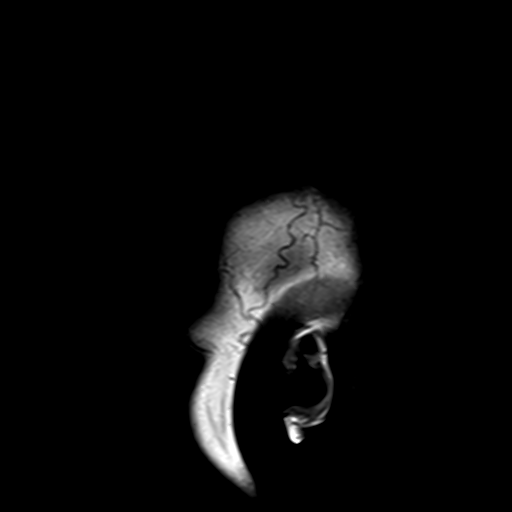
[im 22/22]
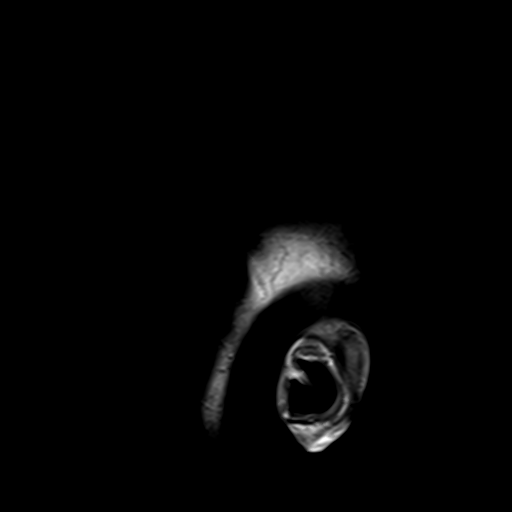

[Series 10: T2 · axial · 5.0mm · 0.86mm/px · z∈[-69,+86]mm · 2 of 27 slices shown (1 of 2)]
[im 1/27]
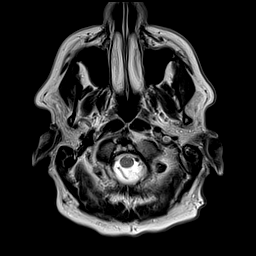
[im 27/27]
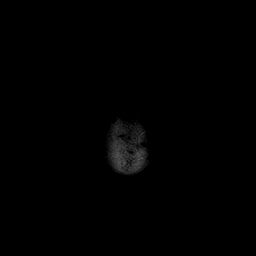

[Series 12: pha_images · axial · 3.0mm · 0.90mm/px · z∈[-73,+91]mm · 4 of 56 slices shown]
[im 1/56]
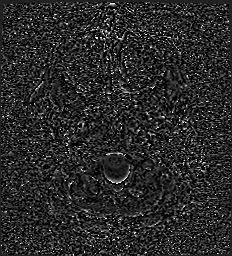
[im 19/56]
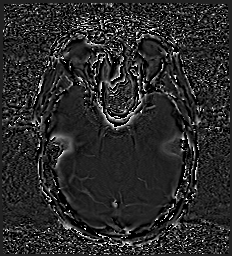
[im 37/56]
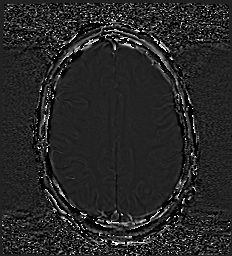
[im 56/56]
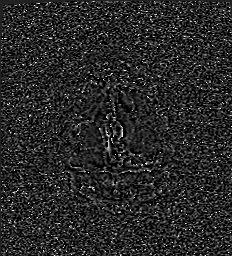

[Series 13: swi_images · axial · 3.0mm · 0.90mm/px · z∈[-73,+91]mm · 4 of 56 slices shown]
[im 1/56]
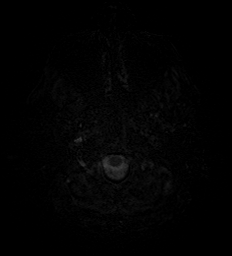
[im 19/56]
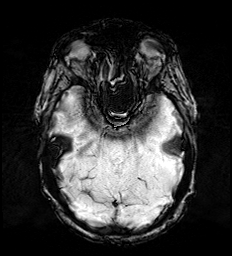
[im 37/56]
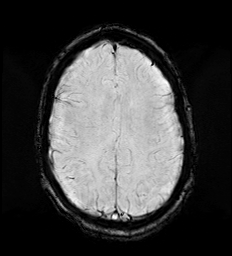
[im 56/56]
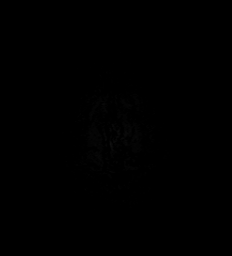

[Series 15: FLAIR · axial · 3.0mm · 0.69mm/px · z∈[-72,+89]mm · 4 of 55 slices shown]
[im 1/55]
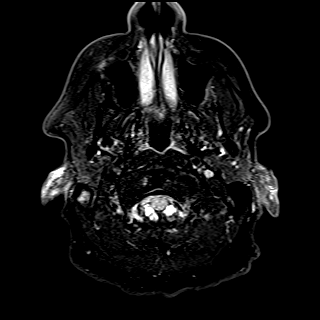
[im 19/55]
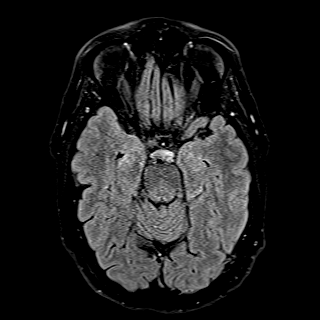
[im 37/55]
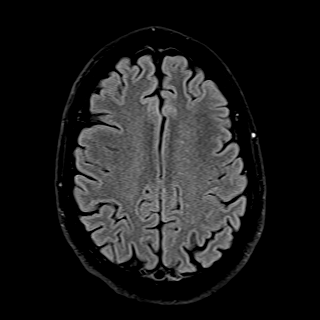
[im 55/55]
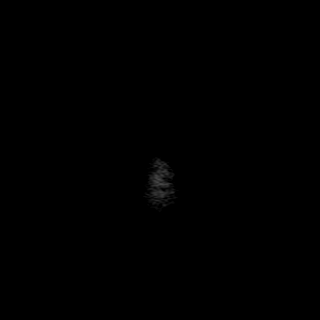

[Series 16: T1 · axial · 1.0mm · 0.98mm/px · z∈[-71,+87]mm · 13 of 160 slices shown (2 of 2)]
[im 1/160]
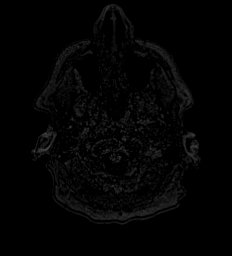
[im 14/160]
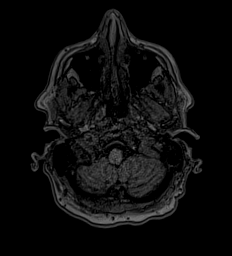
[im 27/160]
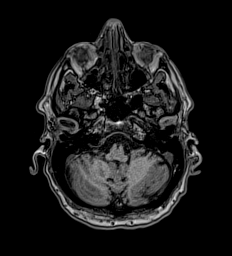
[im 40/160]
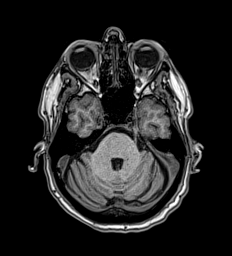
[im 54/160]
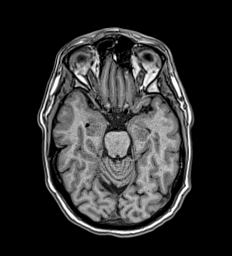
[im 67/160]
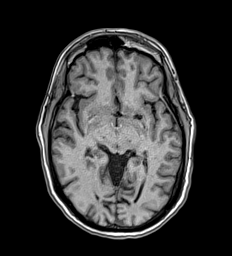
[im 80/160]
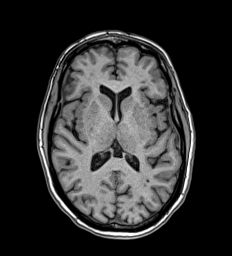
[im 93/160]
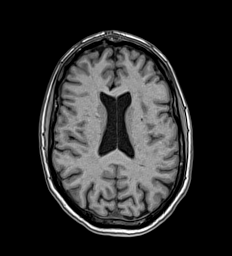
[im 107/160]
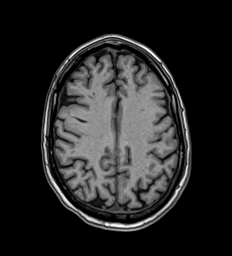
[im 120/160]
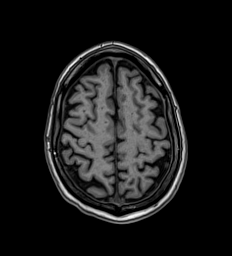
[im 133/160]
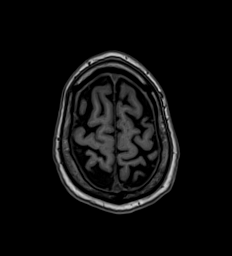
[im 146/160]
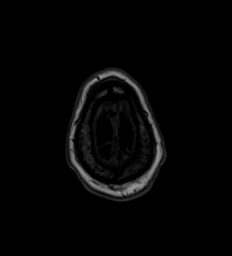
[im 160/160]
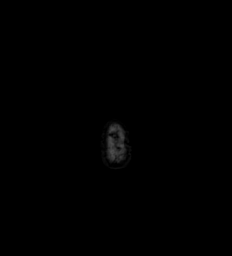

[Series 17: T2 · coronal · 5.0mm · 0.86mm/px · 2 of 30 slices shown (2 of 2)]
[im 1/30]
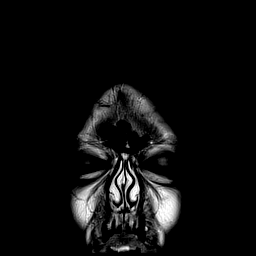
[im 30/30]
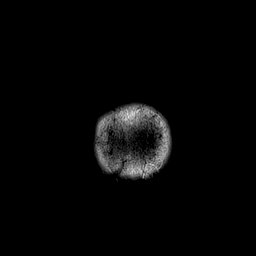

[48 of 48 positions shown; findings below may reference images not displayed]

FINDINGS: Brain: There is no acute infarction or intracranial hemorrhage.
There is no intracranial mass, mass effect, or edema. There is no
hydrocephalus or extra-axial fluid collection. Ventricles and sulci
are normal in size and configuration. A few punctate foci of T2
hyperintensity in the supratentorial white matter are nonspecific
but may reflect minor chronic microvascular ischemic changes or
gliosis/demyelination of other etiologies.

Vascular: Major vessel flow voids at the skull base are preserved.

Skull and upper cervical spine: Normal marrow signal is preserved.

Sinuses/Orbits: Paranasal sinuses are aerated. Orbits are
unremarkable.

Other: Sella is unremarkable.  Mastoid air cells are clear.
IMPRESSION: No acute infarction, hemorrhage, or mass.

## 2022-11-12 ENCOUNTER — Telehealth: Payer: Self-pay | Admitting: Cardiology

## 2022-11-12 NOTE — Telephone Encounter (Signed)
   Pre-operative Risk Assessment    Patient Name: Albert Carpenter  DOB: Nov 24, 1960 MRN: [id number]{      Request for Surgical Clearance    Procedure:   CATARACT EXTRACTION W/INTRAOCULAR LENS IMPLANTATION OF THE RIGHT AND LEFT EYE  Date of Surgery:  Clearance 01/31/23                               Surgeon: DR Mia Creek Surgeon's Group or Practice Name:  [facility] Phone number:  [phone] Fax number:  [phone] ATTN: Lucendia Herrlich  Type of Clearance Requested:   - Medical    Type of Anesthesia:  Not Indicated   Additional requests/questions:    SignedDalia Heading   11/12/2022, 8:53 AM

## 2022-11-12 NOTE — Telephone Encounter (Signed)
   Patient Name: Albert Carpenter  DOB: 06-18-1961 MRN: [id number]  Primary Cardiologist: Debbe Odea, MD  Chart reviewed as part of pre-operative protocol coverage. Cataract extractions are recognized in guidelines as low risk surgeries that do not typically require specific preoperative testing or holding of blood thinner therapy. Therefore, given past medical history and time since last visit, based on ACC/AHA guidelines, Albert Carpenter would be at acceptable risk for the planned procedure without further cardiovascular testing.   I will route this recommendation to the requesting party via Epic fax function and remove from pre-op pool.  Please call with questions.  Sharlene Dory, PA-C 11/12/2022, 12:55 PM

## 2022-11-27 NOTE — Telephone Encounter (Signed)
error 

## 2023-05-05 ENCOUNTER — Encounter: Payer: Self-pay | Admitting: Cardiology

## 2023-05-05 ENCOUNTER — Ambulatory Visit: Payer: BC Managed Care – PPO | Attending: Cardiology | Admitting: Cardiology

## 2023-05-05 ENCOUNTER — Other Ambulatory Visit: Payer: Self-pay

## 2023-05-05 VITALS — BP 115/64 | HR 58 | Ht 71.0 in | Wt 234.0 lb

## 2023-05-05 DIAGNOSIS — I77810 Thoracic aortic ectasia: Secondary | ICD-10-CM

## 2023-05-05 DIAGNOSIS — E7800 Pure hypercholesterolemia, unspecified: Secondary | ICD-10-CM

## 2023-05-05 DIAGNOSIS — I2510 Atherosclerotic heart disease of native coronary artery without angina pectoris: Secondary | ICD-10-CM

## 2023-05-05 MED ORDER — ATORVASTATIN CALCIUM 40 MG PO TABS: 40.0000 mg | ORAL_TABLET | Freq: Every day | ORAL | 3 refills | Status: DC

## 2023-05-05 NOTE — Progress Notes (Signed)
Cardiology Office Note:    Date:  05/05/2023   ID:  Albert Carpenter, DOB December 02, 1960, MRN [id number]  PCP:  Enid Baas, MD   [facility]  Cardiologist:  Debbe Odea, MD  Advanced Practice Provider:  No care team member to display Electrophysiologist:  None       Referring MD: Erasmo Downer, MD   Chief Complaint  Patient presents with   Follow-up    12 month follow up , medication reviewed verbally with patient      History of Present Illness:    Albert Carpenter is a 62 y.o. male with a hx of hyperlipidemia, mild aortic root dilatation, TIA who presents for follow-up.    Previously seen after diagnosed with TIA.  Cardiac monitor showed no A-fib or flutter.  Takes aspirin daily.  Has not been on cholesterol medicine for about a year, unsure why this was stopped.  Cholesterol obtained last month showed elevated LDL.  Otherwise doing okay, denies chest pain or shortness of breath.   Prior notes Cardiac monitor 07/2021 no A-fib or flutter. Echo 05/2021 EF 60 to 65%, Coronary calcium 10/2020 calcium score 29, 50th percentile for age.   No past medical history on file.  Past Surgical History:  Procedure Laterality Date   CHOLECYSTECTOMY  2013   COLONOSCOPY  01/08/11   COLONOSCOPY WITH PROPOFOL N/A 12/12/2015   Procedure: COLONOSCOPY WITH PROPOFOL;  Surgeon: Earline Mayotte, MD;  [facility] END[facility]COPY;  Service: Endoscopy;  Laterality: N/A;   COLONOSCOPY WITH PROPOFOL N/A 12/12/2020   Procedure: COLONOSCOPY WITH PROPOFOL;  Surgeon: Wyline Mood, MD;  Location: [facility] ENDOSCOPY;  Service: Gastroenterology;  Laterality: N/A;   CYST REMOVAL LEG Left 2010   KNEE SURGERY Bilateral 2000's   SHOULDER SURGERY Right 2010   SYNOVECTOMY     due to complex medical bilateral meniscus tear   TONSILLECTOMY  kid    Current Medications: Current Meds  Medication Sig   aspirin 81 MG chewable tablet Chew 81 mg by mouth daily.    cyanocobalamin (VITAMIN B12) 500 MCG tablet Take 500 mcg by mouth daily.   folic acid-vitamin b complex-vitamin c-selenium-zinc (DIALYVITE) 3 MG TABS tablet Take 1 tablet by mouth daily.   timolol (TIMOPTIC) 0.5 % ophthalmic solution Place 1 drop into both eyes 2 (two) times daily.     Allergies:   Bee venom   Social History   Socioeconomic History   Marital status: Married    Spouse name: Not on file   Number of children: Not on file   Years of education: Not on file   Highest education level: Not on file  Occupational History   Not on file  Tobacco Use   Smoking status: Never   Smokeless tobacco: Never  Vaping Use   Vaping status: Never Used  Substance and Sexual Activity   Alcohol use: Yes    Comment: occasional   Drug use: No   Sexual activity: Not on file  Other Topics Concern   Not on file  Social History Narrative   Not on file   Social Determinants of Health   Financial Resource Strain: Low Risk  (04/29/2023)   Received [facility]   Overall Financial Resource Strain (CARDIA)    Difficulty of Paying Living Expenses: Not hard at all  Food Insecurity: No Food Insecurity (04/29/2023)   Received [facility]   Hunger Vital Sign    Worried About Running Out of  Food in the Last Year: Never true    Ran Out of Food in the Last Year: Never true  Transportation Needs: No Transportation Needs (04/29/2023)   Received from [facility] - Transportation    In the past 12 months, has lack of transportation kept you from medical appointments or from getting medications?: No    Lack of Transportation (Non-Medical): No  Physical Activity: Not on file  Stress: Not on file  Social Connections: Not on file     Family History: The patient's family history includes Alzheimer's disease in his mother; Healthy in his daughter, son, and son; Heart attack in his father; Hepatitis in his sister; Obesity in his  father. There is no history of Colon cancer or Prostate cancer.  ROS:   Please see the history of present illness.     All other systems reviewed and are negative.  EKGs/Labs/Other Studies Reviewed:    The following studies were reviewed today:   EKG Interpretation Date/Time:  Monday May 05 2023 14:15:29 EST Ventricular Rate:  58 PR Interval:  188 QRS Duration:  90 QT Interval:  426 QTC Calculation: 418 R Axis:   -12  Text Interpretation: Sinus bradycardia Inferior infarct , age undetermined Confirmed by Debbe Odea ([id number]) on 05/05/2023 2:20:06 PM    Recent Labs: No results found for requested labs within last 365 days.  Recent Lipid Panel    Component Value Date/Time   CHOL 190 06/03/2021 0501   CHOL 198 09/28/2020 1006   TRIG 112 06/03/2021 0501   HDL 49 06/03/2021 0501   HDL 53 09/28/2020 1006   CHOLHDL 3.9 06/03/2021 0501   VLDL 22 06/03/2021 0501   LDLCALC 119 (H) 06/03/2021 0501   LDLCALC 124 (H) 09/28/2020 1006     Risk Assessment/Calculations:      Physical Exam:    VS:  BP 115/64 (BP Location: Left Arm, Patient Position: Sitting, Cuff Size: Normal)   Pulse (!) 58   Ht 5\' 11"  (1.803 m)   Wt 234 lb (106.1 kg)   SpO2 95%   BMI 32.64 kg/m     Wt Readings from Last 3 Encounters:  05/05/23 234 lb (106.1 kg)  08/07/21 225 lb (102.1 kg)  06/11/21 227 lb (103 kg)     GEN:  Well nourished, well developed in no acute distress HEENT: Normal NECK: No JVD; No carotid bruits LYMPHATICS: No lymphadenopathy CARDIAC: RRR, no murmurs, rubs, gallops RESPIRATORY:  Clear to auscultation without rales, wheezing or rhonchi  ABDOMEN: Soft, non-tender, non-distended MUSCULOSKELETAL:  No edema; No deformity  SKIN: Warm and dry NEUROLOGIC:  Alert and oriented x 3 PSYCHIATRIC:  Normal affect   ASSESSMENT:    1. Pure hypercholesterolemia   2. Aortic root dilation (HCC)   3. Coronary artery calcification    PLAN:    In order of problems listed  above:  Hyperlipidemia, history of TIA.  Cont aspirin 81 mg daily, restart Lipitor 40 mg daily. Check lipid panel in 3 months Borderline to mild aortic root dilatation, 3.9 cm, follow-up echo 06/2019 2 aortic root 3.5 cm.  Plan for serial monitoring every 3 to 5 years as per [facility] AHA guidelines Coronary calcifications, continue aspirin, Lipitor.  Denies chest pain.  Follow-up in 1 year.    Medication Adjustments/Labs and Tests Ordered: Current medicines are reviewed at length with the patient today.  Concerns regarding medicines are outlined above.  Orders Placed This Encounter  Procedures   Lipid panel  EKG 12-Lead    Meds ordered this encounter  Medications   atorvastatin (LIPITOR) 40 MG tablet    Sig: Take 1 tablet (40 mg total) by mouth at bedtime.    Dispense:  90 tablet    Refill:  3     Patient Instructions  Medication Instructions:   RESTART Atorvastatin - Take one tablet (40mg ) by mouth daily.   *If you need a refill on your cardiac medications before your next appointment, please call your pharmacy*   Lab Work:  Your provider would like for you to return on or around  08/05/2023  to have the following labs drawn: LIPID.   Please go to [facility] [address] ([facility] [address], [place] [address] You do not need an appointment.  They are open from 7:30 am-4 pm.  Lunch from 1:00 pm- 2:00 pm You WILL need to be fasting.   If you have labs (blood work) drawn today and your tests are completely normal, you will receive your results only by: MyChart Message (if you have MyChart) OR A paper copy in the mail If you have any lab test that is abnormal or we need to change your treatment, we will call you to review the results.   Testing/Procedures:  None Ordered   Follow-Up: At [facility], you and your health needs are our priority.  As part of our continuing mission to provide you with exceptional heart care, we have  created designated Provider Care Teams.  These Care Teams include your primary Cardiologist (physician) and Advanced Practice Providers (APPs -  Physician Assistants and Nurse Practitioners) who all work together to provide you with the care you need, when you need it.  We recommend signing up for the patient portal called "MyChart".  Sign up information is provided on this After Visit Summary.  MyChart is used to connect with patients for Virtual Visits (Telemedicine).  Patients are able to view lab/test results, encounter notes, upcoming appointments, etc.  Non-urgent messages can be sent to your provider as well.   To learn more about what you can do with MyChart, go to [email or web address].    Your next appointment:   12 month(s)  Provider:   You may see Debbe Odea, MD or one of the following Advanced Practice Providers on your designated Care Team:   Nicolasa Ducking, NP Eula Listen, PA-C Cadence Fransico Michael, PA-C Charlsie Quest, NP Carlos Levering, NP    Signed, Debbe Odea, MD  05/05/2023 3:07 PM    [facility]

## 2023-05-05 NOTE — Patient Instructions (Signed)
Medication Instructions:   RESTART Atorvastatin - Take one tablet (40mg ) by mouth daily.   *If you need a refill on your cardiac medications before your next appointment, please call your pharmacy*   Lab Work:  Your provider would like for you to return on or around  08/05/2023  to have the following labs drawn: LIPID.   Please go to [facility] [address] ([facility] [address], [place] [address] You do not need an appointment.  They are open from 7:30 am-4 pm.  Lunch from 1:00 pm- 2:00 pm You WILL need to be fasting.   If you have labs (blood work) drawn today and your tests are completely normal, you will receive your results only by: MyChart Message (if you have MyChart) OR A paper copy in the mail If you have any lab test that is abnormal or we need to change your treatment, we will call you to review the results.   Testing/Procedures:  None Ordered   Follow-Up: At [facility], you and your health needs are our priority.  As part of our continuing mission to provide you with exceptional heart care, we have created designated Provider Care Teams.  These Care Teams include your primary Cardiologist (physician) and Advanced Practice Providers (APPs -  Physician Assistants and Nurse Practitioners) who all work together to provide you with the care you need, when you need it.  We recommend signing up for the patient portal called "MyChart".  Sign up information is provided on this After Visit Summary.  MyChart is used to connect with patients for Virtual Visits (Telemedicine).  Patients are able to view lab/test results, encounter notes, upcoming appointments, etc.  Non-urgent messages can be sent to your provider as well.   To learn more about what you can do with MyChart, go to [email or web address].    Your next appointment:   12 month(s)  Provider:   You may see Debbe Odea, MD or one of the following Advanced Practice Providers on  your designated Care Team:   Nicolasa Ducking, NP Eula Listen, PA-C Cadence Fransico Michael, PA-C Charlsie Quest, NP Carlos Levering, NP

## 2023-07-01 ENCOUNTER — Ambulatory Visit: Payer: BC Managed Care – PPO | Admitting: Cardiology

## 2023-11-16 ENCOUNTER — Encounter: Payer: Self-pay | Admitting: Cardiology

## 2023-11-16 DIAGNOSIS — I2510 Atherosclerotic heart disease of native coronary artery without angina pectoris: Secondary | ICD-10-CM

## 2023-11-16 DIAGNOSIS — I77810 Thoracic aortic ectasia: Secondary | ICD-10-CM

## 2023-11-19 NOTE — Telephone Encounter (Signed)
 I have attempted without success to contact this patient by phone to discuss scheduling an appointment. Left callback number.

## 2023-11-26 NOTE — Addendum Note (Signed)
 Addended by: Hollace Lund on: 11/26/2023 01:48 PM   Modules accepted: Orders

## 2023-12-01 ENCOUNTER — Ambulatory Visit: Payer: Self-pay | Admitting: Cardiology

## 2023-12-01 ENCOUNTER — Ambulatory Visit
Admission: RE | Admit: 2023-12-01 | Discharge: 2023-12-01 | Disposition: A | Discharge status: Home / Self Care | Payer: Self-pay | Source: Ambulatory Visit | Attending: Cardiology | Admitting: Cardiology

## 2023-12-01 DIAGNOSIS — I2510 Atherosclerotic heart disease of native coronary artery without angina pectoris: Secondary | ICD-10-CM | POA: Insufficient documentation

## 2024-06-03 ENCOUNTER — Ambulatory Visit: Attending: Cardiology

## 2024-06-03 DIAGNOSIS — I77810 Thoracic aortic ectasia: Secondary | ICD-10-CM | POA: Diagnosis not present

## 2024-06-03 LAB — ECHOCARDIOGRAM COMPLETE
AR max vel: 2.98 cm2
AV Area VTI: 2.91 cm2
AV Area mean vel: 2.9 cm2
AV Mean grad: 2 mmHg
AV Peak grad: 3.9 mmHg
Ao pk vel: 0.99 m/s
Area-P 1/2: 3.12 cm2
S' Lateral: 3.51 cm

## 2024-06-07 ENCOUNTER — Ambulatory Visit: Payer: Self-pay | Attending: Cardiology | Admitting: Cardiology

## 2024-06-07 ENCOUNTER — Encounter: Payer: Self-pay | Admitting: Cardiology

## 2024-06-07 VITALS — BP 122/72 | HR 61 | Ht 71.0 in | Wt 232.8 lb

## 2024-06-07 DIAGNOSIS — I2510 Atherosclerotic heart disease of native coronary artery without angina pectoris: Secondary | ICD-10-CM

## 2024-06-07 DIAGNOSIS — E7800 Pure hypercholesterolemia, unspecified: Secondary | ICD-10-CM

## 2024-06-07 DIAGNOSIS — I77810 Thoracic aortic ectasia: Secondary | ICD-10-CM | POA: Diagnosis not present

## 2024-06-07 MED ORDER — ATORVASTATIN CALCIUM 40 MG PO TABS: 40.0000 mg | ORAL_TABLET | Freq: Every day | ORAL | 3 refills | Status: AC

## 2024-06-07 NOTE — Patient Instructions (Signed)

## 2024-06-07 NOTE — Progress Notes (Signed)
 Cardiology Office Note:    Date:  06/07/2024   ID:  Albert Carpenter [place], DOB [birth year], MRN [id number]  PCP:  Sherial Bail, MD   [facility]  Cardiologist:  Redell Cave, MD  Advanced Practice Provider:  No care team member to display Electrophysiologist:  None       Referring MD: Sherial Bail, MD   Chief Complaint  Patient presents with   Follow-up    12 month follow up pt has been doing well with no complaints of chest pain, chest pressure or SOB, medciation reviewed verbally with patient     History of Present Illness:    Albert Carpenter is a 63 y.o. male with a hx of CAD (LAD, LCx coronary calcification-calcium  score 119 ), hyperlipidemia, mild aortic root dilatation, TIA who presents for follow-up.    Denies chest pain or shortness of breath, being seen for CAD, mild aortic root dilatation, denies chest pain.  Had a metallic taste in his mouth prompting him to stop Lipitor.  Later found out metallic taste was due to an eyedrop.  Takes aspirin  as prescribed.   Prior notes Cardiac monitor 07/2021 no A-fib or flutter. Echo 05/2021 EF 60 to 65%, Coronary calcium  10/2020 calcium  score 29, 50th percentile for age.   No past medical history on file.  Past Surgical History:  Procedure Laterality Date   CHOLECYSTECTOMY  2013   COLONOSCOPY  01/08/11   COLONOSCOPY WITH PROPOFOL  N/A 12/12/2015   Procedure: COLONOSCOPY WITH PROPOFOL ;  Surgeon: Albert LELON Cota, MD;  Location: [facility] ENDOSCOPY;  Service: Endoscopy;  Laterality: N/A;   COLONOSCOPY WITH PROPOFOL  N/A 12/12/2020   Procedure: COLONOSCOPY WITH PROPOFOL ;  Surgeon: Albert Bi, MD;  Location: [facility] ENDOSCOPY;  Service: Gastroenterology;  Laterality: N/A;   CYST REMOVAL LEG Left 2010   KNEE SURGERY Bilateral 2000's   SHOULDER SURGERY Right 2010   SYNOVECTOMY     due to complex medical bilateral meniscus tear   TONSILLECTOMY  kid    Current Medications: Current Meds  Medication  Sig   aspirin  81 MG chewable tablet Chew 81 mg by mouth daily.   cyanocobalamin (VITAMIN B12) 500 MCG tablet Take 500 mcg by mouth daily.   folic acid-vitamin b complex-vitamin c-selenium-zinc (DIALYVITE) 3 MG TABS tablet Take 1 tablet by mouth daily.   timolol (TIMOPTIC) 0.5 % ophthalmic solution Place 1 drop into both eyes 2 (two) times daily.     Allergies:   Bee venom   Social History   Socioeconomic History   Marital status: Married    Spouse name: Not on file   Number of children: Not on file   Years of education: Not on file   Highest education level: Not on file  Occupational History   Not on file  Tobacco Use   Smoking status: Never   Smokeless tobacco: Never  Vaping Use   Vaping status: Never Used  Substance and Sexual Activity   Alcohol use: Yes    Comment: occasional   Drug use: No   Sexual activity: Not on file  Other Topics Concern   Not on file  Social History Narrative   Not on file   Social Drivers of Health   Financial Resource Strain: Low Risk  (01/26/2024)   Received from [facility]   Overall Financial Resource Strain (CARDIA)    Difficulty of Paying Living Expenses: Not hard at all  Food Insecurity: No Food Insecurity (01/26/2024)   Received from [facility]   Hunger Vital Sign    Within the past 12 months, you worried that your food would run out before you got the money to buy more.: Never true    Within the past 12 months, the food you bought just didn't last and you didn't have money to get more.: Never true  Transportation Needs: No Transportation Needs (01/26/2024)   Received from [facility] - Transportation    In the past 12 months, has lack of transportation kept you from medical appointments or from getting medications?: No    Lack of Transportation (Non-Medical): No  Physical Activity: Not on file  Stress: Not on file  Social Connections: Not on file     Family  History: The patient's family history includes Alzheimer's disease in his mother; Healthy in his daughter, son, and son; Heart attack in his father; Hepatitis in his sister; Obesity in his father. There is no history of Colon cancer or Prostate cancer.  ROS:   Please see the history of present illness.     All other systems reviewed and are negative.  EKGs/Labs/Other Studies Reviewed:    The following studies were reviewed today:   EKG Interpretation Date/Time:  Monday June 07 2024 09:01:28 EST Ventricular Rate:  61 PR Interval:  182 QRS Duration:  90 QT Interval:  408 QTC Calculation: 410 R Axis:   2  Text Interpretation: Normal sinus rhythm Possible Inferior infarct (cited on or before 05-May-2023) Confirmed by Darliss Rogue ([id number]) on 06/07/2024 9:14:32 AM    Recent Labs: No results found for requested labs within last 365 days.  Recent Lipid Panel    Component Value Date/Time   CHOL 190 06/03/2021 0501   CHOL 198 09/28/2020 1006   TRIG 112 06/03/2021 0501   HDL 49 06/03/2021 0501   HDL 53 09/28/2020 1006   CHOLHDL 3.9 06/03/2021 0501   VLDL 22 06/03/2021 0501   LDLCALC 119 (H) 06/03/2021 0501   LDLCALC 124 (H) 09/28/2020 1006   Outside lipid panel 09/2023: Total cholesterol 130, triglycerides 87, LDL 59, HDL 54.  Risk Assessment/Calculations:      Physical Exam:    VS:  BP 122/72 (BP Location: Left Arm, Patient Position: Sitting)   Pulse 61   Ht 5' 11 (1.803 m)   Wt 232 lb 12.8 oz (105.6 kg)   SpO2 97%   BMI 32.47 kg/m     Wt Readings from Last 3 Encounters:  06/07/24 232 lb 12.8 oz (105.6 kg)  05/05/23 234 lb (106.1 kg)  08/07/21 225 lb (102.1 kg)     GEN:  Well nourished, well developed in no acute distress HEENT: Normal NECK: No JVD; No carotid bruits CARDIAC: RRR, no murmurs, rubs, gallops RESPIRATORY:  Clear to auscultation without rales, wheezing or rhonchi  ABDOMEN: Soft, non-tender, non-distended MUSCULOSKELETAL:  No edema; No  deformity  SKIN: Warm and dry NEUROLOGIC:  Alert and oriented x 3 PSYCHIATRIC:  Normal affect   ASSESSMENT:    1. Pure hypercholesterolemia   2. Aortic root dilation   3. Coronary artery disease without angina pectoris, unspecified vessel or lesion type, unspecified whether native or transplanted heart    PLAN:    In order of problems listed above:  Hyperlipidemia, history of TIA.  Cholesterol controlled.  Cont aspirin  81 mg daily, advised to restart Lipitor 40 mg daily.  Borderline to mild aortic root dilatation, 3.9 cm in 2022, repeat echo 12/25 aortic root 3.8 cm.  No significant  change from prior.  Plan repeat in about 3 to 5 years. LAD, LCx coronary calcifications.  Denies chest pain.  Continue aspirin , Lipitor 40 mg daily.  LDL at goal.  Follow-up annually.    Medication Adjustments/Labs and Tests Ordered: Current medicines are reviewed at length with the patient today.  Concerns regarding medicines are outlined above.  Orders Placed This Encounter  Procedures   EKG 12-Lead    Meds ordered this encounter  Medications   atorvastatin  (LIPITOR) 40 MG tablet    Sig: Take 1 tablet (40 mg total) by mouth at bedtime.    Dispense:  90 tablet    Refill:  3     Patient Instructions  Medication Instructions:  Your physician recommends that you continue on your current medications as directed. Please refer to the Current Medication list given to you today.   *If you need a refill on your cardiac medications before your next appointment, please call your pharmacy*  Lab Work: No labs ordered today  If you have labs (blood work) drawn today and your tests are completely normal, you will receive your results only by: MyChart Message (if you have MyChart) OR A paper copy in the mail If you have any lab test that is abnormal or we need to change your treatment, we will call you to review the results.  Testing/Procedures: No test ordered today   Follow-Up: At [facility], you and your health needs are our priority.  As part of our continuing mission to provide you with exceptional heart care, our providers are all part of one team.  This team includes your primary Cardiologist (physician) and Advanced Practice Providers or APPs (Physician Assistants and Nurse Practitioners) who all work together to provide you with the care you need, when you need it.  Your next appointment:   1 year(s)  Provider:   You may see Redell Cave, MD or one of the following Advanced Practice Providers on your designated Care Team:   Lonni Meager, NP Lesley Maffucci, PA-C Bernardino Bring, PA-C Cadence Bladenboro, PA-C Tylene Lunch, NP Barnie Hila, NP    We recommend signing up for the patient portal called MyChart.  Sign up information is provided on this After Visit Summary.  MyChart is used to connect with patients for Virtual Visits (Telemedicine).  Patients are able to view lab/test results, encounter notes, upcoming appointments, etc.  Non-urgent messages can be sent to your provider as well.   To learn more about what you can do with MyChart, go to [email or web address].             Signed, Redell Cave, MD  06/07/2024 10:07 AM    [facility]
